# Patient Record
Sex: Female | Born: 1990 | Race: Black or African American | Hispanic: No | Marital: Single | Smoking: Never smoker
Health system: Southern US, Community
[De-identification: ages and names within clinical notes are randomized; demographics above are authoritative.]

## PROBLEM LIST (undated history)

## (undated) ENCOUNTER — Inpatient Hospital Stay (HOSPITAL_COMMUNITY): Payer: Self-pay

## (undated) DIAGNOSIS — D649 Anemia, unspecified: Secondary | ICD-10-CM

---

## 2012-01-16 ENCOUNTER — Emergency Department (INDEPENDENT_AMBULATORY_CARE_PROVIDER_SITE_OTHER)
Admission: EM | Admit: 2012-01-16 | Discharge: 2012-01-16 | Disposition: A | Discharge status: Home / Self Care | Payer: Self-pay | Source: Home / Self Care | Attending: Family Medicine | Admitting: Family Medicine

## 2012-01-16 ENCOUNTER — Encounter (HOSPITAL_COMMUNITY): Payer: Self-pay

## 2012-01-16 DIAGNOSIS — S39012A Strain of muscle, fascia and tendon of lower back, initial encounter: Secondary | ICD-10-CM

## 2012-01-16 DIAGNOSIS — S335XXA Sprain of ligaments of lumbar spine, initial encounter: Secondary | ICD-10-CM

## 2012-01-16 HISTORY — DX: Anemia, unspecified: D64.9

## 2012-01-16 LAB — POCT URINALYSIS DIP (DEVICE)
Bilirubin Urine: NEGATIVE
Hgb urine dipstick: NEGATIVE
Ketones, ur: NEGATIVE mg/dL
pH: 6.5 (ref 5.0–8.0)

## 2012-01-16 MED ORDER — IBUPROFEN 800 MG PO TABS: 800.0000 mg | ORAL_TABLET | Freq: Three times a day (TID) | ORAL | Status: AC

## 2012-01-16 MED ORDER — CYCLOBENZAPRINE HCL 5 MG PO TABS: 5.0000 mg | ORAL_TABLET | Freq: Three times a day (TID) | ORAL | Status: AC | PRN

## 2012-01-16 NOTE — ED Notes (Addendum)
Pt has low back pain that started on 1-31.  No known injury and denies urinary symptoms.

## 2012-01-16 NOTE — ED Provider Notes (Addendum)
History     CSN: [id number]  Arrival date & time 01/16/12  1042   First MD Initiated Contact with Patient 01/16/12 1225      Chief Complaint  Patient presents with  . Back Pain    (Consider location/radiation/quality/duration/timing/severity/associated sxs/prior treatment) Patient is a 21 y.o. female presenting with back pain. The history is provided by the patient.  Back Pain  This is a new problem. The current episode started more than 1 week ago. The problem has not changed since onset.The pain is associated with no known injury. The pain is present in the lumbar spine. The quality of the pain is described as shooting. The pain does not radiate. The pain is mild. The symptoms are aggravated by bending, twisting and certain positions. Pertinent negatives include no chest pain, no fever, no numbness, no abdominal pain, no abdominal swelling, no bowel incontinence, no perianal numbness, no paresthesias and no tingling. She has tried nothing for the symptoms.    Past Medical History  Diagnosis Date  . Anemia     History reviewed. No pertinent past surgical history.  History reviewed. No pertinent family history.  History  Substance Use Topics  . Smoking status: Never Smoker   . Smokeless tobacco: Not on file  . Alcohol Use: Yes    OB History    Grav Para Term Preterm Abortions TAB SAB Ect Mult Living                  Review of Systems  Constitutional: Negative for fever.  HENT: Negative.   Cardiovascular: Negative for chest pain.  Gastrointestinal: Negative.  Negative for abdominal pain and bowel incontinence.  Genitourinary: Negative.   Musculoskeletal: Positive for back pain.  Neurological: Negative for tingling, numbness and paresthesias.    Allergies  Review of patient's allergies indicates no known allergies.  Home Medications   Current Outpatient Rx  Name Route Sig Dispense Refill  . ACETAMINOPHEN 325 MG PO TABS Oral Take 650 mg by mouth every 6 (six)  hours as needed.    Marland Kitchen FERROUS SULFATE 325 (65 FE) MG PO TABS Oral Take 325 mg by mouth daily with breakfast.    . CYCLOBENZAPRINE HCL 5 MG PO TABS Oral Take 1 tablet (5 mg total) by mouth 3 (three) times daily as needed for muscle spasms. 30 tablet 0  . IBUPROFEN 800 MG PO TABS Oral Take 1 tablet (800 mg total) by mouth 3 (three) times daily. As needed for soreness 30 tablet 0    BP 166/71  Pulse 92  Temp(Src) 97.9 F (36.6 C) (Oral)  Resp 18  SpO2 100%  LMP 01/01/2012  Physical Exam  Nursing note and vitals reviewed. Constitutional: She is oriented to person, place, and time. She appears well-developed and well-nourished.  Musculoskeletal: Normal range of motion. She exhibits tenderness.       Minor soreness with rom, no radiation of pain, abd benign.  Neurological: She is alert and oriented to person, place, and time.  Skin: Skin is warm.    ED Course  Procedures (including critical care time)  Labs Reviewed  POCT URINALYSIS DIP (DEVICE) - Abnormal; Notable for the following:    Leukocytes, UA LARGE (*) Biochemical Testing Only. Please order routine urinalysis from main lab if confirmatory testing is needed.   All other components within normal limits   No results found.   1. Strain of lumbar paraspinous muscle       MDM  Barkley Bruns, MD 01/16/12 1240  Barkley Bruns, MD 01/16/12 [phone]

## 2012-10-21 ENCOUNTER — Encounter (HOSPITAL_COMMUNITY): Payer: Self-pay

## 2012-10-21 ENCOUNTER — Emergency Department (INDEPENDENT_AMBULATORY_CARE_PROVIDER_SITE_OTHER)
Admission: EM | Admit: 2012-10-21 | Discharge: 2012-10-21 | Disposition: A | Discharge status: Home / Self Care | Payer: Self-pay | Source: Home / Self Care | Attending: Emergency Medicine | Admitting: Emergency Medicine

## 2012-10-21 DIAGNOSIS — J111 Influenza due to unidentified influenza virus with other respiratory manifestations: Secondary | ICD-10-CM

## 2012-10-21 DIAGNOSIS — N390 Urinary tract infection, site not specified: Secondary | ICD-10-CM

## 2012-10-21 DIAGNOSIS — R6889 Other general symptoms and signs: Secondary | ICD-10-CM

## 2012-10-21 LAB — POCT URINALYSIS DIP (DEVICE)
Bilirubin Urine: NEGATIVE
Glucose, UA: NEGATIVE mg/dL
Nitrite: POSITIVE — AB
Specific Gravity, Urine: 1.02 (ref 1.005–1.030)

## 2012-10-21 LAB — POCT RAPID STREP A: Streptococcus, Group A Screen (Direct): NEGATIVE

## 2012-10-21 MED ORDER — ONDANSETRON HCL 4 MG PO TABS: 4.0000 mg | ORAL_TABLET | Freq: Four times a day (QID) | ORAL | Status: DC

## 2012-10-21 MED ORDER — CEPHALEXIN 500 MG PO CAPS: 500.0000 mg | ORAL_CAPSULE | Freq: Three times a day (TID) | ORAL | Status: DC

## 2012-10-21 NOTE — ED Notes (Signed)
Sore throat, body aches since 10/13/12

## 2012-10-21 NOTE — ED Provider Notes (Signed)
History     CSN: [id number]  Arrival date & time 10/21/12  1247   First MD Initiated Contact with Patient 10/21/12 1601      Chief Complaint  Patient presents with  . Sore Throat    (Consider location/radiation/quality/duration/timing/severity/associated sxs/prior treatment) Patient is a 21 y.o. female presenting with pharyngitis. The history is provided by the patient.  Sore Throat This is a new problem. The current episode started more than 1 week ago. The problem occurs daily. Associated symptoms include chest pain, abdominal pain, headaches and shortness of breath.    Past Medical History  Diagnosis Date  . Anemia     History reviewed. No pertinent past surgical history.  No family history on file.  History  Substance Use Topics  . Smoking status: Never Smoker   . Smokeless tobacco: Not on file  . Alcohol Use: Yes    OB History    Grav Para Term Preterm Abortions TAB SAB Ect Mult Living                  Review of Systems  Constitutional: Positive for fever, chills, appetite change and fatigue.  HENT: Positive for congestion, sore throat, rhinorrhea, neck pain and sinus pressure.   Respiratory: Positive for cough and shortness of breath.   Cardiovascular: Positive for chest pain.  Gastrointestinal: Positive for nausea and abdominal pain.  Musculoskeletal: Positive for myalgias.  Neurological: Positive for headaches.  All other systems reviewed and are negative.    Allergies  Review of patient's allergies indicates no known allergies.  Home Medications   Current Outpatient Rx  Name  Route  Sig  Dispense  Refill  . ACETAMINOPHEN 325 MG PO TABS   Oral   Take 650 mg by mouth every 6 (six) hours as needed.         Marland Kitchen FERROUS SULFATE 325 (65 FE) MG PO TABS   Oral   Take 325 mg by mouth daily with breakfast.         . CEPHALEXIN 500 MG PO CAPS   Oral   Take 1 capsule (500 mg total) by mouth 3 (three) times daily.   30 capsule   0   .  ONDANSETRON HCL 4 MG PO TABS   Oral   Take 1 tablet (4 mg total) by mouth every 6 (six) hours.   12 tablet   0     BP 147/82  Pulse 121  Temp 101.9 F (38.8 C) (Oral)  Resp 20  SpO2 100%  LMP 09/30/2012  Physical Exam  Nursing note and vitals reviewed. Constitutional: She is oriented to person, place, and time. Vital signs are normal. She appears well-developed and well-nourished. She is active and cooperative.  HENT:  Head: Normocephalic.  Right Ear: Hearing, tympanic membrane, external ear and ear canal normal.  Left Ear: Hearing, tympanic membrane, external ear and ear canal normal.  Nose: Nose normal. Right sinus exhibits no maxillary sinus tenderness and no frontal sinus tenderness. Left sinus exhibits no maxillary sinus tenderness and no frontal sinus tenderness.  Mouth/Throat: Uvula is midline and mucous membranes are normal. Posterior oropharyngeal edema and posterior oropharyngeal erythema present.  Eyes: Conjunctivae normal are normal. Pupils are equal, round, and reactive to light. No scleral icterus.  Neck: Trachea normal, normal range of motion and full passive range of motion without pain. Neck supple.  Cardiovascular: Normal rate, regular rhythm, normal heart sounds, intact distal pulses and normal pulses.   Pulmonary/Chest: Effort normal and breath sounds  normal.  Abdominal: Soft. Bowel sounds are normal. There is tenderness in the suprapubic area. There is no CVA tenderness.  Musculoskeletal: Normal range of motion.  Lymphadenopathy:       Head (right side): No submental, no submandibular, no tonsillar, no preauricular, no posterior auricular and no occipital adenopathy present.       Head (left side): No submental, no submandibular, no tonsillar, no preauricular, no posterior auricular and no occipital adenopathy present.  Neurological: She is alert and oriented to person, place, and time. She has normal strength. No cranial nerve deficit or sensory deficit.  Coordination and gait normal. GCS eye subscore is 4. GCS verbal subscore is 5. GCS motor subscore is 6.  Skin: Skin is warm and dry. No rash noted.  Psychiatric: She has a normal mood and affect. Her speech is normal and behavior is normal. Judgment and thought content normal. Cognition and memory are normal.    ED Course  Procedures (including critical care time)  Labs Reviewed  POCT URINALYSIS DIP (DEVICE) - Abnormal; Notable for the following:    Ketones, ur 40 (*)     Hgb urine dipstick MODERATE (*)     Nitrite POSITIVE (*)     Leukocytes, UA SMALL (*)  Biochemical Testing Only. Please order routine urinalysis from main lab if confirmatory testing is needed.   All other components within normal limits  POCT RAPID STREP A ([facility] CARE ONLY)  POCT PREGNANCY, URINE  URINE CULTURE   No results found.   1. Flu-like symptoms   2. UTI (lower urinary tract infection)       MDM  Take medications as prescribed, increase fluids, follow up with primary care if symptoms are not improved.        Johnsie Kindred, NP 10/21/12 1646

## 2012-10-21 NOTE — ED Provider Notes (Signed)
Medical screening examination/treatment/procedure(s) were performed by non-physician practitioner and as supervising physician I was immediately available for consultation/collaboration.  Leslee Home, M.D.   Reuben Likes, MD 10/21/12 2112

## 2012-10-24 LAB — URINE CULTURE: Special Requests: NORMAL

## 2012-10-25 NOTE — ED Notes (Signed)
Urine culture: >100,000 colonies E. Coli.  Pt. adequately treated with Keflex. Nicole Patel 10/25/2012

## 2012-12-07 NOTE — L&D Delivery Note (Signed)
Delivery Note At 12:06 PM a viable female was delivered via Vaginal, Spontaneous Delivery (Presentation: Left Occiput Anterior).  APGAR: 9, 9; weight .   Placenta status: Intact, Spontaneous.  Cord: 3 vessels with the following complications: None.  Cord pH: not done  Anesthesia: Epidural  Episiotomy: None Lacerations: None Suture Repair: 2.0 Est. Blood Loss (mL): 250  Mom to postpartum.  Baby to nursery-stable.  Kaly Mcquary A 07/22/2013, 12:22 PM

## 2013-01-17 ENCOUNTER — Encounter (HOSPITAL_COMMUNITY): Payer: Self-pay

## 2013-01-17 ENCOUNTER — Inpatient Hospital Stay (HOSPITAL_COMMUNITY)
Admission: AD | Admit: 2013-01-17 | Discharge: 2013-01-17 | Disposition: A | Discharge status: Home / Self Care | Payer: Managed Care, Other (non HMO) | Source: Ambulatory Visit | Attending: Obstetrics & Gynecology | Admitting: Obstetrics & Gynecology

## 2013-01-17 DIAGNOSIS — O219 Vomiting of pregnancy, unspecified: Secondary | ICD-10-CM

## 2013-01-17 DIAGNOSIS — O210 Mild hyperemesis gravidarum: Secondary | ICD-10-CM | POA: Insufficient documentation

## 2013-01-17 DIAGNOSIS — R109 Unspecified abdominal pain: Secondary | ICD-10-CM | POA: Insufficient documentation

## 2013-01-17 LAB — URINE MICROSCOPIC-ADD ON

## 2013-01-17 LAB — URINALYSIS, ROUTINE W REFLEX MICROSCOPIC
Glucose, UA: NEGATIVE mg/dL
Protein, ur: NEGATIVE mg/dL
pH: 6 (ref 5.0–8.0)

## 2013-01-17 LAB — POCT PREGNANCY, URINE: Preg Test, Ur: POSITIVE — AB

## 2013-01-17 MED ORDER — PROMETHAZINE HCL 25 MG PO TABS: 12.5000 mg | ORAL_TABLET | Freq: Four times a day (QID) | ORAL | Status: DC | PRN

## 2013-01-17 NOTE — MAU Provider Note (Signed)
Attestation of Attending Supervision of Advanced Practitioner (PA/CNM/NP): Evaluation and management procedures were performed by the Advanced Practitioner under my supervision and collaboration.  I have reviewed the Advanced Practitioner's note and chart, and I agree with the management and plan.  Teffany Blaszczyk, MD, FACOG Attending Obstetrician & Gynecologist Faculty Practice, [facility]  

## 2013-01-17 NOTE — MAU Note (Signed)
Patient is in with c/o n/v and intermittent cramping since last week. She denies vaginal bleeding or abnormal vaginal discharge. Patient states that her lmp was 11/22/12. Patient states that she tolerates jello, saltines crackers and some fluids.

## 2013-01-17 NOTE — MAU Provider Note (Signed)
History     CSN: [id number]  Arrival date and time: 01/17/13 0917   First Provider Initiated Contact with Patient 01/17/13 [id number]     Chief Complaint  Patient presents with  . Nausea  . Emesis  . Abdominal Cramping  . Amenorrhea   HPI  S: Nicole Patel is a 22 y.o. female G1P0 presenting today with a history of abdominal pain, nausea, and vomiting. She states the onset of symptoms started 5 days ago and lasted until yesterday. She states the pain was located in her lower abdomen and it did not radiate. The pain would come and go, but would cause her nausea and vomiting. She states she vomited about 2x/day since last Thursday. She was wondering if she could be pregnant, and that's why she is presenting today (pregnancy verification). However, today her symptoms are resolved and she is currently not experiencing any abdominal pain and has not vomited since yesterday. She states, "I'm fine today."  She still has nausea, but it has lessened in severity.   She denies dysuria, hematuria, change in stool, fever, chills. She also denies any vaginal bleeding, vaginal discharge, itching, burning.  OB History   Grav Para Term Preterm Abortions TAB SAB Ect Mult Living   1              Past Medical History  Diagnosis Date  . Anemia    History reviewed. No pertinent past surgical history.  History reviewed. No pertinent family history.  History  Substance Use Topics  . Smoking status: Never Smoker   . Smokeless tobacco: Not on file  . Alcohol Use: Yes   Allergies: No Known Allergies  Prescriptions prior to admission  Medication Sig Dispense Refill  . ferrous sulfate 325 (65 FE) MG tablet Take 325 mg by mouth daily with breakfast.       ROS  Negative, except what is noted in the HPI  Poshysical Exam   Height 5\' 3"  (1.6 m), weight 167 lb 2 oz (75.807 kg), last menstrual period 11/22/2012.  Physical Exam  General appearance: alert, cooperative, appears stated age and no  distress Head: Normocephalic, without obvious abnormality, atraumatic Back: symmetric, ROM normal, no CVA tenderness Abdomen: soft, non-tender; bowel sounds normal; no masses,  no organomegaly Extremities: extremities normal, atraumatic, no cyanosis or edema  MAU Course  Procedures  MDM Results for orders placed during the hospital encounter of 01/17/13 (from the past 24 hour(s))  POCT PREGNANCY, URINE     Status: Abnormal   Collection Time    01/17/13  9:37 AM      Result Value Range   Preg Test, Ur POSITIVE (*) NEGATIVE    Assessment and Plan  1.) Nausea - Phenergan 25 mg by mouth every 6 hours. Use as needed. 2.) Advised to pursue prenatal care.  NEESE,HOPE 01/17/2013, 10:21 AM   I examined the patient her abdomen is soft, non tender with deep palpation. Patient request pregnancy verification letter so she can get her insurance in place and start prenatal care and work note for today.   Instructions on pregnancy and nausea given information on what to take to HD to get insurance in place and medication for nausea.  Discussed with the patient and all questioned fully answered. She will return if any problems arise.    Medication List    TAKE these medications       ferrous sulfate 325 (65 FE) MG tablet  Take 325 mg by mouth daily with breakfast.  promethazine 25 MG tablet  Commonly known as:  PHENERGAN  Take 0.5 tablets (12.5 mg total) by mouth every 6 (six) hours as needed for nausea.

## 2013-01-19 LAB — URINE CULTURE: Colony Count: >=100000

## 2013-01-20 ENCOUNTER — Other Ambulatory Visit: Payer: Self-pay | Admitting: Obstetrics & Gynecology

## 2013-01-20 DIAGNOSIS — O2340 Unspecified infection of urinary tract in pregnancy, unspecified trimester: Secondary | ICD-10-CM

## 2013-01-20 MED ORDER — CEPHALEXIN 500 MG PO CAPS: 500.0000 mg | ORAL_CAPSULE | Freq: Four times a day (QID) | ORAL | Status: DC

## 2013-01-20 NOTE — Progress Notes (Signed)
Faculty Practice OB/GYN Attending Note  Patient seen in MAU on 01/17/13; had concerning UA but was asymptomatic.  Urine culture was sent that showed >100K colonies of E.coli resistant to ampicillin but sensitive to cephalosporins and other antibiotics. Keflex e-prescribed, patient was called but was not available.  A message was left for her to call MAU and speak to one of the MAU providers who will notify her of her diagnosis and advise her to pick up prescription.  She will also be advised to follow up with OB provider as instructed.  Jaynie Collins, MD, FACOG Attending Obstetrician & Gynecologist Faculty Practice, [facility]

## 2013-02-02 ENCOUNTER — Other Ambulatory Visit (HOSPITAL_COMMUNITY): Payer: Self-pay | Admitting: Obstetrics

## 2013-02-02 DIAGNOSIS — O26842 Uterine size-date discrepancy, second trimester: Secondary | ICD-10-CM

## 2013-02-02 LAB — OB RESULTS CONSOLE HGB/HCT, BLOOD
HCT: 35 %
Hemoglobin: 11.7 g/dL

## 2013-02-02 LAB — OB RESULTS CONSOLE GC/CHLAMYDIA
Chlamydia: NEGATIVE
Gonorrhea: NEGATIVE

## 2013-02-02 LAB — OB RESULTS CONSOLE HEPATITIS B SURFACE ANTIGEN: Hepatitis B Surface Ag: NEGATIVE

## 2013-02-02 LAB — OB RESULTS CONSOLE RPR: RPR: NONREACTIVE

## 2013-02-02 LAB — OB RESULTS CONSOLE PLATELET COUNT: Platelets: 311 10*3/uL

## 2013-02-07 ENCOUNTER — Other Ambulatory Visit (HOSPITAL_COMMUNITY): Payer: Self-pay | Admitting: Obstetrics

## 2013-02-07 ENCOUNTER — Ambulatory Visit (HOSPITAL_COMMUNITY)
Admission: RE | Admit: 2013-02-07 | Discharge: 2013-02-07 | Disposition: A | Discharge status: Home / Self Care | Payer: Managed Care, Other (non HMO) | Source: Ambulatory Visit | Attending: Obstetrics | Admitting: Obstetrics

## 2013-02-07 DIAGNOSIS — O26849 Uterine size-date discrepancy, unspecified trimester: Secondary | ICD-10-CM | POA: Insufficient documentation

## 2013-02-07 DIAGNOSIS — O26842 Uterine size-date discrepancy, second trimester: Secondary | ICD-10-CM

## 2013-02-07 DIAGNOSIS — Z3689 Encounter for other specified antenatal screening: Secondary | ICD-10-CM | POA: Insufficient documentation

## 2013-03-03 ENCOUNTER — Encounter: Payer: Self-pay | Admitting: *Deleted

## 2013-03-03 NOTE — Progress Notes (Unsigned)
02/07/13 - B/P 149/90 and 136/88  1+protein in urine. 02/02/13 - B/P 149/89 and 135/93

## 2013-03-07 ENCOUNTER — Other Ambulatory Visit (HOSPITAL_COMMUNITY): Payer: Self-pay | Admitting: Obstetrics

## 2013-03-07 ENCOUNTER — Ambulatory Visit (INDEPENDENT_AMBULATORY_CARE_PROVIDER_SITE_OTHER): Payer: Managed Care, Other (non HMO) | Admitting: Obstetrics

## 2013-03-07 ENCOUNTER — Encounter: Payer: Self-pay | Admitting: Obstetrics

## 2013-03-07 ENCOUNTER — Ambulatory Visit (INDEPENDENT_AMBULATORY_CARE_PROVIDER_SITE_OTHER): Payer: Managed Care, Other (non HMO)

## 2013-03-07 ENCOUNTER — Other Ambulatory Visit: Payer: Self-pay | Admitting: Obstetrics

## 2013-03-07 VITALS — BP 150/88 | Temp 98.5°F | Wt 175.8 lb

## 2013-03-07 DIAGNOSIS — Z3400 Encounter for supervision of normal first pregnancy, unspecified trimester: Secondary | ICD-10-CM

## 2013-03-07 DIAGNOSIS — Z369 Encounter for antenatal screening, unspecified: Secondary | ICD-10-CM

## 2013-03-07 DIAGNOSIS — Z0489 Encounter for examination and observation for other specified reasons: Secondary | ICD-10-CM

## 2013-03-07 DIAGNOSIS — IMO0002 Reserved for concepts with insufficient information to code with codable children: Secondary | ICD-10-CM

## 2013-03-07 LAB — POCT URINALYSIS DIPSTICK
Blood, UA: NEGATIVE
Glucose, UA: NEGATIVE
Nitrite, UA: NEGATIVE
Urobilinogen, UA: NEGATIVE

## 2013-03-07 LAB — US OB DETAIL + 14 WK

## 2013-03-07 NOTE — Progress Notes (Signed)
Doing well 

## 2013-03-07 NOTE — Progress Notes (Signed)
Pulse: 108

## 2013-04-03 ENCOUNTER — Ambulatory Visit (INDEPENDENT_AMBULATORY_CARE_PROVIDER_SITE_OTHER): Payer: Managed Care, Other (non HMO) | Admitting: Obstetrics

## 2013-04-03 ENCOUNTER — Encounter: Payer: Self-pay | Admitting: Obstetrics

## 2013-04-03 ENCOUNTER — Other Ambulatory Visit: Payer: Managed Care, Other (non HMO)

## 2013-04-03 VITALS — BP 146/94 | Temp 98.3°F | Wt 177.0 lb

## 2013-04-03 DIAGNOSIS — Z3400 Encounter for supervision of normal first pregnancy, unspecified trimester: Secondary | ICD-10-CM

## 2013-04-03 DIAGNOSIS — Z3402 Encounter for supervision of normal first pregnancy, second trimester: Secondary | ICD-10-CM

## 2013-04-03 DIAGNOSIS — N390 Urinary tract infection, site not specified: Secondary | ICD-10-CM

## 2013-04-03 LAB — POCT URINALYSIS DIPSTICK
Spec Grav, UA: <=1.005
Urobilinogen, UA: NEGATIVE
pH, UA: >=9

## 2013-04-03 LAB — CBC
Hemoglobin: 11.2 g/dL — ABNORMAL LOW (ref 12.0–15.0)
MCH: 24.4 pg — ABNORMAL LOW (ref 26.0–34.0)
Platelets: 339 10*3/uL (ref 150–400)
RBC: 4.59 MIL/uL (ref 3.87–5.11)
WBC: 10.6 10*3/uL — ABNORMAL HIGH (ref 4.0–10.5)

## 2013-04-03 NOTE — Progress Notes (Signed)
Pulse: 108

## 2013-04-04 LAB — HIV ANTIBODY (ROUTINE TESTING W REFLEX): HIV: NONREACTIVE

## 2013-04-04 LAB — GLUCOSE TOLERANCE, 2 HOURS W/ 1HR
Glucose, 1 hour: 85 mg/dL (ref 70–170)
Glucose, 2 hour: 83 mg/dL (ref 70–139)
Glucose, Fasting: 56 mg/dL — ABNORMAL LOW (ref 70–99)

## 2013-04-05 LAB — CULTURE, OB URINE

## 2013-04-07 ENCOUNTER — Other Ambulatory Visit: Payer: Self-pay | Admitting: *Deleted

## 2013-04-07 DIAGNOSIS — O2342 Unspecified infection of urinary tract in pregnancy, second trimester: Secondary | ICD-10-CM

## 2013-04-07 MED ORDER — NITROFURANTOIN MONOHYD MACRO 100 MG PO CAPS: 100.0000 mg | ORAL_CAPSULE | Freq: Two times a day (BID) | ORAL | Status: AC

## 2013-04-07 NOTE — Progress Notes (Signed)
Pt aware of results and prescription sent to pt's pharmacy. Pt expressed understanding.   

## 2013-04-18 ENCOUNTER — Ambulatory Visit (INDEPENDENT_AMBULATORY_CARE_PROVIDER_SITE_OTHER): Payer: Managed Care, Other (non HMO) | Admitting: Obstetrics

## 2013-04-18 ENCOUNTER — Encounter: Payer: Managed Care, Other (non HMO) | Admitting: Obstetrics

## 2013-04-18 VITALS — BP 129/85 | Temp 98.3°F | Wt 176.2 lb

## 2013-04-18 DIAGNOSIS — Z3400 Encounter for supervision of normal first pregnancy, unspecified trimester: Secondary | ICD-10-CM

## 2013-04-18 DIAGNOSIS — Z3403 Encounter for supervision of normal first pregnancy, third trimester: Secondary | ICD-10-CM

## 2013-04-18 LAB — POCT URINALYSIS DIPSTICK
Bilirubin, UA: NEGATIVE
Blood, UA: NEGATIVE
Glucose, UA: NEGATIVE
Ketones, UA: NEGATIVE
Spec Grav, UA: 1.015

## 2013-04-18 NOTE — Progress Notes (Signed)
Pulse: 99 

## 2013-04-19 ENCOUNTER — Encounter: Payer: Self-pay | Admitting: Obstetrics

## 2013-05-04 ENCOUNTER — Ambulatory Visit (INDEPENDENT_AMBULATORY_CARE_PROVIDER_SITE_OTHER): Payer: Managed Care, Other (non HMO) | Admitting: Obstetrics

## 2013-05-04 VITALS — BP 127/88 | Temp 98.0°F | Wt 180.6 lb

## 2013-05-04 DIAGNOSIS — K219 Gastro-esophageal reflux disease without esophagitis: Secondary | ICD-10-CM

## 2013-05-04 DIAGNOSIS — Z3402 Encounter for supervision of normal first pregnancy, second trimester: Secondary | ICD-10-CM

## 2013-05-04 DIAGNOSIS — Z3400 Encounter for supervision of normal first pregnancy, unspecified trimester: Secondary | ICD-10-CM

## 2013-05-04 LAB — POCT URINALYSIS DIPSTICK
Bilirubin, UA: NEGATIVE
Glucose, UA: NEGATIVE
Ketones, UA: NEGATIVE
Protein, UA: NEGATIVE
Spec Grav, UA: <=1.005

## 2013-05-04 MED ORDER — RANITIDINE HCL 150 MG PO TABS: 150.0000 mg | ORAL_TABLET | Freq: Two times a day (BID) | ORAL | Status: DC

## 2013-05-04 NOTE — Progress Notes (Signed)
Pulse- 98 

## 2013-05-05 ENCOUNTER — Encounter: Payer: Self-pay | Admitting: Obstetrics

## 2013-05-10 ENCOUNTER — Encounter: Payer: Self-pay | Admitting: Obstetrics

## 2013-05-16 ENCOUNTER — Encounter: Payer: Self-pay | Admitting: Obstetrics

## 2013-05-16 LAB — US OB DETAIL + 14 WK

## 2013-05-18 ENCOUNTER — Ambulatory Visit (INDEPENDENT_AMBULATORY_CARE_PROVIDER_SITE_OTHER): Payer: Managed Care, Other (non HMO) | Admitting: Obstetrics

## 2013-05-18 VITALS — BP 137/86 | Temp 98.6°F | Wt 184.0 lb

## 2013-05-18 DIAGNOSIS — Z3480 Encounter for supervision of other normal pregnancy, unspecified trimester: Secondary | ICD-10-CM

## 2013-05-18 DIAGNOSIS — Z3483 Encounter for supervision of other normal pregnancy, third trimester: Secondary | ICD-10-CM

## 2013-05-18 LAB — POCT URINALYSIS DIPSTICK
Bilirubin, UA: NEGATIVE
Blood, UA: NEGATIVE
Glucose, UA: NEGATIVE
Nitrite, UA: NEGATIVE
Urobilinogen, UA: NEGATIVE
pH, UA: 6

## 2013-05-18 NOTE — Progress Notes (Signed)
Pulse: 108

## 2013-06-05 ENCOUNTER — Ambulatory Visit (INDEPENDENT_AMBULATORY_CARE_PROVIDER_SITE_OTHER): Payer: Managed Care, Other (non HMO) | Admitting: Obstetrics

## 2013-06-05 ENCOUNTER — Encounter: Payer: Self-pay | Admitting: Obstetrics

## 2013-06-05 VITALS — BP 130/84 | Temp 99.0°F | Wt 188.0 lb

## 2013-06-05 DIAGNOSIS — Z3400 Encounter for supervision of normal first pregnancy, unspecified trimester: Secondary | ICD-10-CM

## 2013-06-05 DIAGNOSIS — Z3403 Encounter for supervision of normal first pregnancy, third trimester: Secondary | ICD-10-CM

## 2013-06-05 LAB — POCT URINALYSIS DIPSTICK
Nitrite, UA: NEGATIVE
Protein, UA: NEGATIVE
Spec Grav, UA: 1.01
Urobilinogen, UA: NEGATIVE

## 2013-06-05 NOTE — Progress Notes (Signed)
P 112 Patient c/o back pain- constant.

## 2013-06-19 ENCOUNTER — Ambulatory Visit (INDEPENDENT_AMBULATORY_CARE_PROVIDER_SITE_OTHER): Payer: Managed Care, Other (non HMO) | Admitting: Obstetrics

## 2013-06-19 ENCOUNTER — Encounter: Payer: Self-pay | Admitting: Obstetrics

## 2013-06-19 VITALS — BP 128/85 | Temp 97.9°F | Wt 190.8 lb

## 2013-06-19 DIAGNOSIS — Z3403 Encounter for supervision of normal first pregnancy, third trimester: Secondary | ICD-10-CM

## 2013-06-19 DIAGNOSIS — Z3400 Encounter for supervision of normal first pregnancy, unspecified trimester: Secondary | ICD-10-CM

## 2013-06-19 LAB — POCT URINALYSIS DIPSTICK
Nitrite, UA: NEGATIVE
Protein, UA: NEGATIVE
Urobilinogen, UA: NEGATIVE

## 2013-06-19 NOTE — Progress Notes (Signed)
Pulse: 116 

## 2013-06-26 ENCOUNTER — Ambulatory Visit (INDEPENDENT_AMBULATORY_CARE_PROVIDER_SITE_OTHER): Payer: Managed Care, Other (non HMO) | Admitting: Obstetrics

## 2013-06-26 ENCOUNTER — Encounter: Payer: Self-pay | Admitting: Obstetrics

## 2013-06-26 VITALS — BP 123/86 | Temp 98.0°F | Wt 193.0 lb

## 2013-06-26 DIAGNOSIS — Z3400 Encounter for supervision of normal first pregnancy, unspecified trimester: Secondary | ICD-10-CM

## 2013-06-26 DIAGNOSIS — Z3403 Encounter for supervision of normal first pregnancy, third trimester: Secondary | ICD-10-CM

## 2013-06-26 LAB — US OB DETAIL + 14 WK

## 2013-06-26 NOTE — Progress Notes (Signed)
Pulse- 101 

## 2013-06-28 LAB — STREP B DNA PROBE: GBSP: NEGATIVE

## 2013-07-03 ENCOUNTER — Ambulatory Visit (INDEPENDENT_AMBULATORY_CARE_PROVIDER_SITE_OTHER): Payer: Managed Care, Other (non HMO) | Admitting: Obstetrics

## 2013-07-03 ENCOUNTER — Encounter: Payer: Self-pay | Admitting: Obstetrics

## 2013-07-03 VITALS — BP 125/86 | Temp 98.2°F | Wt 193.8 lb

## 2013-07-03 DIAGNOSIS — Z3403 Encounter for supervision of normal first pregnancy, third trimester: Secondary | ICD-10-CM

## 2013-07-03 DIAGNOSIS — Z3400 Encounter for supervision of normal first pregnancy, unspecified trimester: Secondary | ICD-10-CM

## 2013-07-03 LAB — POCT URINALYSIS DIPSTICK
Bilirubin, UA: NEGATIVE
Ketones, UA: NEGATIVE
Nitrite, UA: NEGATIVE

## 2013-07-03 NOTE — Progress Notes (Signed)
Pulse: 84

## 2013-07-10 ENCOUNTER — Ambulatory Visit (INDEPENDENT_AMBULATORY_CARE_PROVIDER_SITE_OTHER): Payer: Managed Care, Other (non HMO) | Admitting: Obstetrics

## 2013-07-10 ENCOUNTER — Encounter: Payer: Self-pay | Admitting: Obstetrics

## 2013-07-10 VITALS — BP 129/91 | Temp 98.3°F | Wt 199.0 lb

## 2013-07-10 DIAGNOSIS — Z3403 Encounter for supervision of normal first pregnancy, third trimester: Secondary | ICD-10-CM

## 2013-07-10 DIAGNOSIS — Z3400 Encounter for supervision of normal first pregnancy, unspecified trimester: Secondary | ICD-10-CM

## 2013-07-10 LAB — POCT URINALYSIS DIPSTICK
Bilirubin, UA: NEGATIVE
Glucose, UA: NEGATIVE
Nitrite, UA: NEGATIVE
Urobilinogen, UA: NEGATIVE
pH, UA: 7

## 2013-07-10 NOTE — Progress Notes (Signed)
P-103 Pt states she is having lower abdomen and vaginal pressure.

## 2013-07-17 ENCOUNTER — Ambulatory Visit (INDEPENDENT_AMBULATORY_CARE_PROVIDER_SITE_OTHER): Payer: Managed Care, Other (non HMO) | Admitting: Obstetrics

## 2013-07-17 ENCOUNTER — Encounter: Payer: Self-pay | Admitting: Obstetrics

## 2013-07-17 VITALS — BP 133/84 | Temp 98.2°F | Wt 194.2 lb

## 2013-07-17 DIAGNOSIS — Z3403 Encounter for supervision of normal first pregnancy, third trimester: Secondary | ICD-10-CM

## 2013-07-17 DIAGNOSIS — Z3400 Encounter for supervision of normal first pregnancy, unspecified trimester: Secondary | ICD-10-CM

## 2013-07-17 LAB — POCT URINALYSIS DIPSTICK
Glucose, UA: NEGATIVE
Nitrite, UA: NEGATIVE
Urobilinogen, UA: NEGATIVE

## 2013-07-17 NOTE — Progress Notes (Signed)
Pulse- 96 

## 2013-07-21 ENCOUNTER — Inpatient Hospital Stay (HOSPITAL_COMMUNITY)
Admission: AD | Admit: 2013-07-21 | Discharge: 2013-07-24 | DRG: 775 | Disposition: A | Discharge status: Home / Self Care | Payer: Managed Care, Other (non HMO) | Source: Ambulatory Visit | Attending: Obstetrics | Admitting: Obstetrics

## 2013-07-21 ENCOUNTER — Encounter (HOSPITAL_COMMUNITY): Payer: Self-pay | Admitting: *Deleted

## 2013-07-21 LAB — CBC
HCT: 33.8 % — ABNORMAL LOW (ref 36.0–46.0)
MCV: 67.6 fL — ABNORMAL LOW (ref 78.0–100.0)
RBC: 5 MIL/uL (ref 3.87–5.11)
WBC: 13.9 10*3/uL — ABNORMAL HIGH (ref 4.0–10.5)

## 2013-07-21 MED ORDER — LACTATED RINGERS IV SOLN
INTRAVENOUS | Status: DC
  Administered 2013-07-21 – 2013-07-22 (×3): via INTRAVENOUS

## 2013-07-21 MED ORDER — LIDOCAINE HCL (PF) 1 % IJ SOLN
30.0000 mL | INTRAMUSCULAR | Status: DC | PRN
  Filled 2013-07-21 (×2): qty 30

## 2013-07-21 MED ORDER — EPHEDRINE 5 MG/ML INJ
10.0000 mg | INTRAVENOUS | Status: DC | PRN
  Filled 2013-07-21: qty 2

## 2013-07-21 MED ORDER — FENTANYL 2.5 MCG/ML BUPIVACAINE 1/10 % EPIDURAL INFUSION (WH - ANES)
14.0000 mL/h | INTRAMUSCULAR | Status: DC | PRN
  Administered 2013-07-22: 14 mL/h via EPIDURAL
  Filled 2013-07-21: qty 125

## 2013-07-21 MED ORDER — EPHEDRINE 5 MG/ML INJ
10.0000 mg | INTRAVENOUS | Status: DC | PRN
  Filled 2013-07-21: qty 2
  Filled 2013-07-21: qty 4

## 2013-07-21 MED ORDER — DIPHENHYDRAMINE HCL 50 MG/ML IJ SOLN: 12.5000 mg | INTRAMUSCULAR | Status: DC | PRN

## 2013-07-21 MED ORDER — NALBUPHINE SYRINGE 5 MG/0.5 ML
5.0000 mg | INJECTION | INTRAMUSCULAR | Status: DC | PRN
  Administered 2013-07-21 – 2013-07-22 (×3): 5 mg via INTRAVENOUS
  Filled 2013-07-21 (×4): qty 0.5

## 2013-07-21 MED ORDER — ONDANSETRON HCL 4 MG/2ML IJ SOLN
4.0000 mg | Freq: Four times a day (QID) | INTRAMUSCULAR | Status: DC | PRN
  Administered 2013-07-21: 4 mg via INTRAVENOUS
  Filled 2013-07-21: qty 2

## 2013-07-21 MED ORDER — OXYCODONE-ACETAMINOPHEN 5-325 MG PO TABS
1.0000 | ORAL_TABLET | ORAL | Status: DC | PRN
  Administered 2013-07-22: 1 via ORAL
  Filled 2013-07-21: qty 2

## 2013-07-21 MED ORDER — PHENYLEPHRINE 40 MCG/ML (10ML) SYRINGE FOR IV PUSH (FOR BLOOD PRESSURE SUPPORT)
80.0000 ug | PREFILLED_SYRINGE | INTRAVENOUS | Status: DC | PRN
  Filled 2013-07-21: qty 2

## 2013-07-21 MED ORDER — ACETAMINOPHEN 325 MG PO TABS: 650.0000 mg | ORAL_TABLET | ORAL | Status: DC | PRN

## 2013-07-21 MED ORDER — PHENYLEPHRINE 40 MCG/ML (10ML) SYRINGE FOR IV PUSH (FOR BLOOD PRESSURE SUPPORT)
80.0000 ug | PREFILLED_SYRINGE | INTRAVENOUS | Status: DC | PRN
  Filled 2013-07-21: qty 2
  Filled 2013-07-21: qty 5

## 2013-07-21 MED ORDER — OXYTOCIN BOLUS FROM INFUSION: 500.0000 mL | INTRAVENOUS | Status: DC

## 2013-07-21 MED ORDER — FLEET ENEMA 7-19 GM/118ML RE ENEM: 1.0000 | ENEMA | Freq: Once | RECTAL | Status: DC

## 2013-07-21 MED ORDER — LACTATED RINGERS IV SOLN
500.0000 mL | Freq: Once | INTRAVENOUS | Status: AC
  Administered 2013-07-22: 500 mL via INTRAVENOUS

## 2013-07-21 MED ORDER — LACTATED RINGERS IV SOLN
500.0000 mL | INTRAVENOUS | Status: DC | PRN
  Administered 2013-07-22: 500 mL via INTRAVENOUS

## 2013-07-21 MED ORDER — OXYTOCIN 40 UNITS IN LACTATED RINGERS INFUSION - SIMPLE MED
62.5000 mL/h | INTRAVENOUS | Status: DC
  Administered 2013-07-22: 62.5 mL/h via INTRAVENOUS
  Filled 2013-07-21: qty 1000

## 2013-07-21 MED ORDER — CITRIC ACID-SODIUM CITRATE 334-500 MG/5ML PO SOLN: 30.0000 mL | ORAL | Status: DC | PRN

## 2013-07-21 MED ORDER — IBUPROFEN 600 MG PO TABS
600.0000 mg | ORAL_TABLET | Freq: Four times a day (QID) | ORAL | Status: DC | PRN
  Filled 2013-07-21 (×4): qty 1

## 2013-07-21 NOTE — MAU Note (Signed)
Belenda Cruise RN called for report on pt, states she will call back for bed assignment when available.

## 2013-07-21 NOTE — MAU Note (Signed)
PT presents with complaints of contractions that started last night that have gotten worse throughout the day. States some bloody show but denies any leakage of fluid.

## 2013-07-21 NOTE — Progress Notes (Signed)
Dr Gaynell Face notified of pts VE, FHR pattern, contraction, orders received to admit pt.

## 2013-07-22 ENCOUNTER — Inpatient Hospital Stay (HOSPITAL_COMMUNITY): Payer: Managed Care, Other (non HMO) | Admitting: Anesthesiology

## 2013-07-22 ENCOUNTER — Encounter (HOSPITAL_COMMUNITY): Payer: Self-pay | Admitting: Anesthesiology

## 2013-07-22 ENCOUNTER — Encounter (HOSPITAL_COMMUNITY): Payer: Self-pay | Admitting: *Deleted

## 2013-07-22 MED ORDER — TERBUTALINE SULFATE 1 MG/ML IJ SOLN: 0.2500 mg | Freq: Once | INTRAMUSCULAR | Status: DC | PRN

## 2013-07-22 MED ORDER — DIBUCAINE 1 % RE OINT: 1.0000 "application " | TOPICAL_OINTMENT | RECTAL | Status: DC | PRN

## 2013-07-22 MED ORDER — ONDANSETRON HCL 4 MG PO TABS: 4.0000 mg | ORAL_TABLET | ORAL | Status: DC | PRN

## 2013-07-22 MED ORDER — ONDANSETRON HCL 4 MG/2ML IJ SOLN: 4.0000 mg | INTRAMUSCULAR | Status: DC | PRN

## 2013-07-22 MED ORDER — FENTANYL 2.5 MCG/ML BUPIVACAINE 1/10 % EPIDURAL INFUSION (WH - ANES)
INTRAMUSCULAR | Status: DC | PRN
  Administered 2013-07-22: 14 mL/h via EPIDURAL

## 2013-07-22 MED ORDER — LANOLIN HYDROUS EX OINT: TOPICAL_OINTMENT | CUTANEOUS | Status: DC | PRN

## 2013-07-22 MED ORDER — IBUPROFEN 600 MG PO TABS
600.0000 mg | ORAL_TABLET | Freq: Four times a day (QID) | ORAL | Status: DC
  Administered 2013-07-22 – 2013-07-24 (×8): 600 mg via ORAL
  Filled 2013-07-22 (×4): qty 1

## 2013-07-22 MED ORDER — SIMETHICONE 80 MG PO CHEW: 80.0000 mg | CHEWABLE_TABLET | ORAL | Status: DC | PRN

## 2013-07-22 MED ORDER — FERROUS SULFATE 325 (65 FE) MG PO TABS
325.0000 mg | ORAL_TABLET | Freq: Two times a day (BID) | ORAL | Status: DC
  Administered 2013-07-22 – 2013-07-24 (×4): 325 mg via ORAL
  Filled 2013-07-22 (×4): qty 1

## 2013-07-22 MED ORDER — SENNOSIDES-DOCUSATE SODIUM 8.6-50 MG PO TABS
2.0000 | ORAL_TABLET | Freq: Every day | ORAL | Status: DC
  Administered 2013-07-22 – 2013-07-23 (×2): 2 via ORAL

## 2013-07-22 MED ORDER — WITCH HAZEL-GLYCERIN EX PADS: 1.0000 "application " | MEDICATED_PAD | CUTANEOUS | Status: DC | PRN

## 2013-07-22 MED ORDER — LIDOCAINE HCL (PF) 1 % IJ SOLN
INTRAMUSCULAR | Status: DC | PRN
  Administered 2013-07-22 (×2): 3 mL

## 2013-07-22 MED ORDER — ZOLPIDEM TARTRATE 5 MG PO TABS: 5.0000 mg | ORAL_TABLET | Freq: Every evening | ORAL | Status: DC | PRN

## 2013-07-22 MED ORDER — OXYCODONE-ACETAMINOPHEN 5-325 MG PO TABS
1.0000 | ORAL_TABLET | ORAL | Status: DC | PRN
  Administered 2013-07-23: 1 via ORAL
  Filled 2013-07-22: qty 1

## 2013-07-22 MED ORDER — PRENATAL MULTIVITAMIN CH
1.0000 | ORAL_TABLET | Freq: Every day | ORAL | Status: DC
  Administered 2013-07-23 – 2013-07-24 (×2): 1 via ORAL
  Filled 2013-07-22 (×2): qty 1

## 2013-07-22 MED ORDER — TETANUS-DIPHTH-ACELL PERTUSSIS 5-2.5-18.5 LF-MCG/0.5 IM SUSP
0.5000 mL | Freq: Once | INTRAMUSCULAR | Status: AC
Start: 2013-07-23 — End: 2013-07-23
  Administered 2013-07-23: 0.5 mL via INTRAMUSCULAR
  Filled 2013-07-22: qty 0.5

## 2013-07-22 MED ORDER — BENZOCAINE-MENTHOL 20-0.5 % EX AERO
1.0000 "application " | INHALATION_SPRAY | CUTANEOUS | Status: DC | PRN
  Administered 2013-07-22: 1 via TOPICAL
  Filled 2013-07-22: qty 56

## 2013-07-22 MED ORDER — OXYTOCIN 40 UNITS IN LACTATED RINGERS INFUSION - SIMPLE MED
1.0000 m[IU]/min | INTRAVENOUS | Status: DC
  Administered 2013-07-22: 2 m[IU]/min via INTRAVENOUS

## 2013-07-22 MED ORDER — DIPHENHYDRAMINE HCL 25 MG PO CAPS: 25.0000 mg | ORAL_CAPSULE | Freq: Four times a day (QID) | ORAL | Status: DC | PRN

## 2013-07-22 NOTE — Anesthesia Preprocedure Evaluation (Addendum)
Anesthesia Evaluation  Patient identified by MRN, date of birth, ID band Patient awake    Reviewed: Allergy & Precautions, H&P , NPO status , Patient's Chart, lab work & pertinent test results  Airway Mallampati: II TM Distance: >3 FB Neck ROM: Full    Dental  (+) Dental Advisory Given   Pulmonary neg pulmonary ROS,  breath sounds clear to auscultation        Cardiovascular negative cardio ROS  Rhythm:Regular Rate:Normal     Neuro/Psych negative neurological ROS  negative psych ROS   GI/Hepatic negative GI ROS, Neg liver ROS,   Endo/Other  negative endocrine ROS  Renal/GU negative Renal ROS     Musculoskeletal negative musculoskeletal ROS (+)   Abdominal   Peds  Hematology negative hematology ROS (+)   Anesthesia Other Findings   Reproductive/Obstetrics (+) Pregnancy                           Anesthesia Physical Anesthesia Plan  ASA: II  Anesthesia Plan: Epidural   Post-op Pain Management:    Induction:   Airway Management Planned:   Additional Equipment:   Intra-op Plan:   Post-operative Plan:   Informed Consent: I have reviewed the patients History and Physical, chart, labs and discussed the procedure including the risks, benefits and alternatives for the proposed anesthesia with the patient or authorized representative who has indicated his/her understanding and acceptance.     Plan Discussed with:   Anesthesia Plan Comments:         Anesthesia Quick Evaluation  

## 2013-07-22 NOTE — H&P (Signed)
This is Dr. Francoise Ceo dictating the history and physical on Nicole Patel she's a 22 year old gravida 1 at 43 weeks and 2 days EDC 821 the 14th negative GBS she was admitted in labor 4 cm dilated and she is now 7 cm 90% with the vertex at -2 station amniotomy was performed and the fluids clear her contractions have been 80 regular Past medical history negative Past surgical history negative Social history negative System review noncontributory Physical exam well-developed female in labor HEENT negative Lungs clear to P&A Heart regular rhythm no murmurs no gallops Abdomen term Extremities negative

## 2013-07-22 NOTE — Anesthesia Procedure Notes (Signed)
Epidural Patient location during procedure: OB Start time: 07/22/2013 6:25 AM End time: 07/22/2013 6:45 AM  Staffing Anesthesiologist: Lewie Loron R Performed by: anesthesiologist   Preanesthetic Checklist Completed: patient identified, pre-op evaluation, timeout performed, IV checked, risks and benefits discussed and monitors and equipment checked  Epidural Patient position: sitting Prep: site prepped and draped and DuraPrep Patient monitoring: blood pressure, continuous pulse ox and heart rate Approach: midline Injection technique: LOR air and LOR saline  Needle:  Needle type: Tuohy  Needle gauge: 17 G Needle length: 9 cm Needle insertion depth: 8 cm Catheter type: closed end flexible Catheter size: 19 Gauge Catheter at skin depth: 14 cm Test dose: negative  Assessment Sensory level: T8 Events: blood not aspirated, injection not painful, no injection resistance, negative IV test and no paresthesia  Additional Notes Reason for block:procedure for pain

## 2013-07-22 NOTE — Progress Notes (Signed)
Patient ID: Nicole Patel, female   DOB: 07-02-1991, 23 y.o.   MRN: [id number] Last cervical check was 7 cm she is in the process to get in an epidural at this time

## 2013-07-22 NOTE — Anesthesia Postprocedure Evaluation (Signed)
  Anesthesia Post-op Note  Patient: Nicole Patel  Procedure(s) Performed: * No procedures listed *  Patient Location: Mother/Baby  Anesthesia Type:Epidural  Level of Consciousness: awake  Airway and Oxygen Therapy: Patient Spontanous Breathing  Post-op Pain: mild  Post-op Assessment: Patient's Cardiovascular Status Stable, Respiratory Function Stable, No signs of Nausea or vomiting, Adequate PO intake, Pain level controlled, No headache, No residual numbness and No residual motor weakness  Post-op Vital Signs: Reviewed and stable  Complications: No apparent anesthesia complications

## 2013-07-23 LAB — CBC
Hemoglobin: 9.1 g/dL — ABNORMAL LOW (ref 12.0–15.0)
RBC: 4.3 MIL/uL (ref 3.87–5.11)

## 2013-07-23 NOTE — Progress Notes (Signed)
Patient ID: Nicole Patel, female   DOB: [birth year], 22 y.o.   MRN: [id number] Postpartum day one Vital signs normal Fundus firm Lochia moderate Doing well and

## 2013-07-24 ENCOUNTER — Encounter: Payer: Managed Care, Other (non HMO) | Admitting: Obstetrics

## 2013-07-24 MED ORDER — IBUPROFEN 600 MG PO TABS: 600.0000 mg | ORAL_TABLET | Freq: Four times a day (QID) | ORAL | Status: DC | PRN

## 2013-07-24 MED ORDER — FUSION PLUS PO CAPS: 1.0000 | ORAL_CAPSULE | Freq: Every day | ORAL | Status: DC

## 2013-07-24 MED ORDER — OXYCODONE-ACETAMINOPHEN 5-325 MG PO TABS: 1.0000 | ORAL_TABLET | ORAL | Status: DC | PRN

## 2013-07-24 NOTE — Progress Notes (Signed)
Post Partum Day 2 Subjective: no complaints  Objective: Blood pressure 130/86, pulse 81, temperature 98.4 F (36.9 C), temperature source Oral, resp. rate 19, height 5\' 3"  (1.6 m), weight 194 lb (87.998 kg), last menstrual period 11/22/2012, SpO2 99.00%, unknown if currently breastfeeding.  Physical Exam:  General: alert and no distress Lochia: appropriate Uterine Fundus: firm Incision: healing well DVT Evaluation: No evidence of DVT seen on physical exam.   Recent Labs  07/21/13 1635 07/23/13 0642  HGB 10.7* 9.1*  HCT 33.8* 29.7*    Assessment/Plan: Discharge home   LOS: 3 days   HARPER,CHARLES A 07/24/2013, 9:13 AM

## 2013-07-24 NOTE — Discharge Summary (Signed)
Obstetric Discharge Summary Reason for Admission: onset of labor Prenatal Procedures: ultrasound Intrapartum Procedures: spontaneous vaginal delivery Postpartum Procedures: none Complications-Operative and Postpartum: none Hemoglobin  Date Value Range Status  07/23/2013 9.1* 12.0 - 15.0 g/dL Final  1/61/0960 45.4   Final     HCT  Date Value Range Status  07/23/2013 29.7* 36.0 - 46.0 % Final  02/02/2013 35   Final    Physical Exam:  General: alert and no distress Lochia: appropriate Uterine Fundus: firm Incision: healing well DVT Evaluation: No evidence of DVT seen on physical exam.  Discharge Diagnoses: Term Pregnancy-delivered  Discharge Information: Date: 07/24/2013 Activity: pelvic rest Diet: routine Medications: PNV, Ibuprofen, Colace, Iron and Percocet Condition: stable Instructions: refer to practice specific booklet Discharge to: home Follow-up Information   Follow up with HARPER,CHARLES A, MD. Schedule an appointment as soon as possible for a visit in 2 weeks.   Specialty:  Obstetrics and Gynecology   Contact information:   [address] [place] [address] [phone]       Newborn Data: Live born female  Birth Weight: 7 lb 15 oz (3600 g) APGAR: 9, 9  Home with mother.  HARPER,CHARLES A 07/24/2013, 9:16 AM

## 2013-07-31 ENCOUNTER — Encounter: Payer: Managed Care, Other (non HMO) | Admitting: Obstetrics

## 2013-08-10 ENCOUNTER — Encounter: Payer: Managed Care, Other (non HMO) | Admitting: Obstetrics

## 2013-08-14 ENCOUNTER — Ambulatory Visit: Payer: Managed Care, Other (non HMO) | Admitting: Obstetrics

## 2013-08-18 ENCOUNTER — Ambulatory Visit (INDEPENDENT_AMBULATORY_CARE_PROVIDER_SITE_OTHER): Payer: Managed Care, Other (non HMO) | Admitting: Advanced Practice Midwife

## 2013-08-18 ENCOUNTER — Encounter: Payer: Self-pay | Admitting: Advanced Practice Midwife

## 2013-08-18 VITALS — BP 133/94 | HR 90 | Temp 98.0°F | Ht 63.0 in | Wt 181.0 lb

## 2013-08-18 DIAGNOSIS — IMO0001 Reserved for inherently not codable concepts without codable children: Secondary | ICD-10-CM

## 2013-08-18 DIAGNOSIS — Z309 Encounter for contraceptive management, unspecified: Secondary | ICD-10-CM

## 2013-08-18 DIAGNOSIS — Z3202 Encounter for pregnancy test, result negative: Secondary | ICD-10-CM

## 2013-08-18 LAB — POCT URINE PREGNANCY: Preg Test, Ur: NEGATIVE

## 2013-08-18 MED ORDER — MEDROXYPROGESTERONE ACETATE 150 MG/ML IM SUSP: 150.0000 mg | INTRAMUSCULAR | Status: DC

## 2013-08-18 NOTE — Progress Notes (Signed)
.   Subjective:     Nicole Patel is a 22 y.o. female who presents for a postpartum visit. She is 3 weeks postpartum following a spontaneous vaginal delivery. I have fully reviewed the prenatal and intrapartum course. The delivery was at 39 gestational weeks. Outcome: spontaneous vaginal delivery. Anesthesia: epidural. Postpartum course has been normal. Baby's course has been normal. Baby is feeding by bottle Rush Barer. Bleeding no bleeding. Bowel function is normal. Bladder function is normal. Patient is not sexually active. Contraception method is none. Postpartum depression screening: negative.  Patient is interested in starting the Depo injections.  The following portions of the patient's history were reviewed and updated as appropriate: allergies, current medications, past family history, past medical history, past social history, past surgical history and problem list.  Review of Systems A comprehensive review of systems was negative.   Objective:    BP 138/99  Pulse 90  Temp(Src) 98 F (36.7 C) (Oral)  Ht 5\' 3"  (1.6 m)  Wt 181 lb (82.101 kg)  BMI 32.07 kg/m2  Breastfeeding? No  General:  alert and cooperative           Abdomen:   soft, non-tender; bowel sounds normal; no masses,  no organomegaly and fundus firm      Repeat BP 133/94 Assessment:     3 week postpartum exam. Pap smear not done at today's visit.  Canindate for Depo  Plan:    1. Contraception: Depo-Provera injections 2. REviewed for patient to abstain or use condoms prior to injection 3. Follow up in: 1 weeks or as needed.  4. RTC for Nurse visit BP check and Depo. If BP remains elevated consider treatment.   Derrick Tiegs Wilson Singer CNM

## 2013-08-24 ENCOUNTER — Ambulatory Visit: Payer: Managed Care, Other (non HMO)

## 2013-08-25 ENCOUNTER — Ambulatory Visit (INDEPENDENT_AMBULATORY_CARE_PROVIDER_SITE_OTHER): Payer: Managed Care, Other (non HMO) | Admitting: *Deleted

## 2013-08-25 VITALS — BP 130/89 | HR 80 | Temp 98.4°F | Ht 63.0 in | Wt 181.6 lb

## 2013-08-25 DIAGNOSIS — Z3049 Encounter for surveillance of other contraceptives: Secondary | ICD-10-CM

## 2013-08-28 MED ORDER — MEDROXYPROGESTERONE ACETATE 150 MG/ML IM SUSP
150.0000 mg | INTRAMUSCULAR | Status: AC
  Administered 2013-08-28: 150 mg via INTRAMUSCULAR

## 2013-08-28 NOTE — Progress Notes (Signed)
Pt in office for a Depo Injection. Pt due for next injection on 11/16/13.

## 2013-09-07 ENCOUNTER — Ambulatory Visit: Payer: Managed Care, Other (non HMO) | Admitting: Obstetrics

## 2013-09-07 ENCOUNTER — Encounter: Payer: Self-pay | Admitting: Obstetrics

## 2013-09-07 NOTE — Progress Notes (Unsigned)
Subjective:     Nicole Patel is a 22 y.o. female who presents for a postpartum visit. She is 6 weeks postpartum following a spontaneous vaginal delivery. I have fully reviewed the prenatal and intrapartum course. The delivery was at 39 gestational weeks. Outcome: spontaneous vaginal delivery. Anesthesia: epidural. Postpartum course has been WNL. Baby's course has been WNL. Baby is feeding by bottle - Daron Offer. Bleeding no bleeding. Bowel function is normal. Bladder function is normal. Patient is not sexually active. Contraception method is Depo-Provera injections. Postpartum depression screening: negative.  The following portions of the patient's history were reviewed and updated as appropriate: allergies, current medications, past family history, past medical history, past social history, past surgical history and problem list.  Review of Systems {ros; complete:30496}   Objective:    BP 133/96  Pulse 83  Temp(Src) 97.8 F (36.6 C)  Wt 186 lb (84.369 kg)  BMI 32.96 kg/m2  LMP 08/14/2013  Breastfeeding? No  General:  {gen appearance:16600}   Breasts:  {breast exam:1202::"inspection negative, no nipple discharge or bleeding, no masses or nodularity palpable"}  Lungs: {lung exam:16931}  Heart:  {heart exam:5510}  Abdomen: {abdomen exam:16834}   Vulva:  {labia exam:12198}  Vagina: {vagina exam:12200}  Cervix:  {cervix exam:14595}  Corpus: {uterus exam:12215}  Adnexa:  {adnexa exam:12223}  Rectal Exam: {rectal/vaginal exam:12274}        Assessment:    *** postpartum exam. Pap smear {done:10129} at today's visit.   Plan:    1. Contraception: {method:5051} 2. *** 3. Follow up in: {1-10:13787} {time; units:19136} or as needed.

## 2013-09-18 ENCOUNTER — Ambulatory Visit (INDEPENDENT_AMBULATORY_CARE_PROVIDER_SITE_OTHER): Payer: Managed Care, Other (non HMO) | Admitting: Obstetrics

## 2013-09-18 ENCOUNTER — Encounter: Payer: Self-pay | Admitting: Obstetrics

## 2013-09-18 VITALS — BP 136/93 | HR 89 | Temp 98.8°F | Ht 63.0 in | Wt 188.0 lb

## 2013-09-18 DIAGNOSIS — Z309 Encounter for contraceptive management, unspecified: Secondary | ICD-10-CM

## 2013-09-18 DIAGNOSIS — Z3202 Encounter for pregnancy test, result negative: Secondary | ICD-10-CM

## 2013-09-18 DIAGNOSIS — IMO0001 Reserved for inherently not codable concepts without codable children: Secondary | ICD-10-CM

## 2013-09-18 LAB — POCT URINE PREGNANCY: Preg Test, Ur: NEGATIVE

## 2013-09-18 NOTE — Progress Notes (Signed)
Subjective:     Nicole Patel is a 22 y.o. female who presents for a postpartum visit. She is 7 weeks postpartum following a spontaneous vaginal delivery. I have fully reviewed the prenatal and intrapartum course. The delivery was at 39 gestational weeks. Outcome: spontaneous vaginal delivery. Anesthesia: spinal. Postpartum course has been WNL. Baby's course has been WNL. Baby is feeding by bottle Rush Barer- Soothe. Bleeding no bleeding. Bowel function is normal. Bladder function is normal. Patient is not sexually active. Contraception method is abstinence and Depo-Provera injections. Postpartum depression screening: negative.  The following portions of the patient's history were reviewed and updated as appropriate: allergies, current medications, past family history, past medical history, past social history, past surgical history and problem list.  Review of Systems Pertinent items are noted in HPI.   Objective:    BP 136/93  Pulse 89  Temp(Src) 98.8 F (37.1 C)  Ht 5\' 3"  (1.6 m)  Wt 188 lb (85.276 kg)  BMI 33.31 kg/m2  LMP 08/14/2013  Breastfeeding? No  General:  alert and no distress   Breasts:  inspection negative, no nipple discharge or bleeding, no masses or nodularity palpable  Abdomen:  Soft, NT Pelvic: Uterus NSSC, NT.                                  Assessment:     Normal postpartum exam. Pap smear not done at today's visit.   Plan:    1. Contraception: Depo-Provera injections 2. Continue PNV's 3. Follow up in: several months or as needed.

## 2013-11-16 ENCOUNTER — Ambulatory Visit: Payer: Managed Care, Other (non HMO) | Admitting: Obstetrics

## 2013-11-16 ENCOUNTER — Ambulatory Visit: Payer: Managed Care, Other (non HMO)

## 2014-10-08 ENCOUNTER — Encounter: Payer: Self-pay | Admitting: Obstetrics

## 2014-10-20 IMAGING — US US OB DETAIL+14 WK
1 series · 12 of 28 positions shown · non-contrast
Comparison: none

[Series 1: us ob comp less 14 wks · 67 acquisitions, 12 frames shown]
[im 3/67]
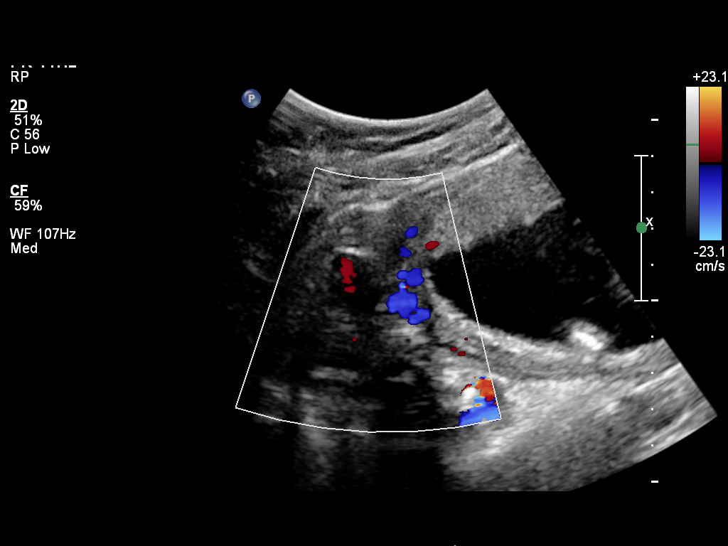
[im 8/67]
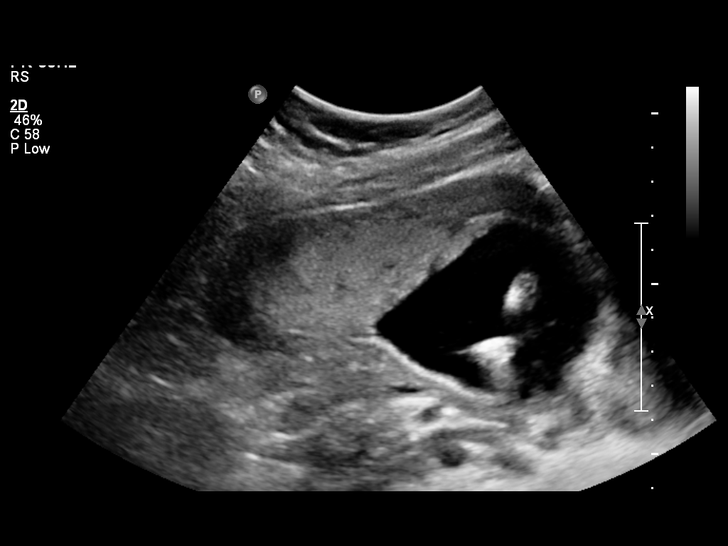
[im 13/67]
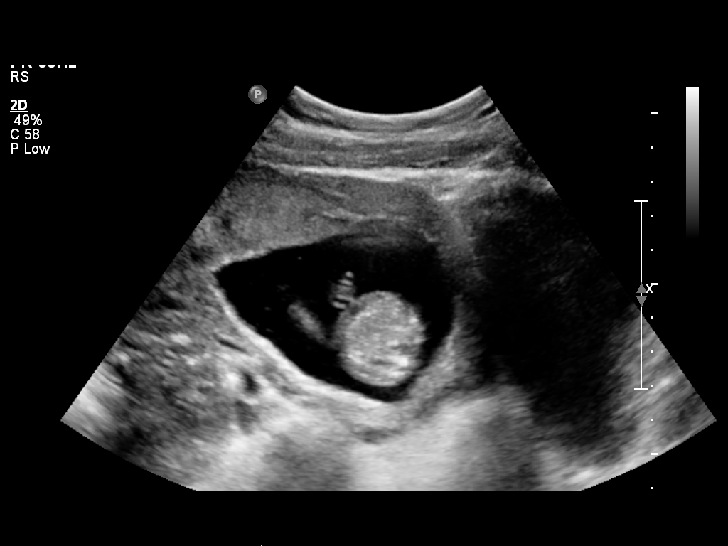
[im 20/67]
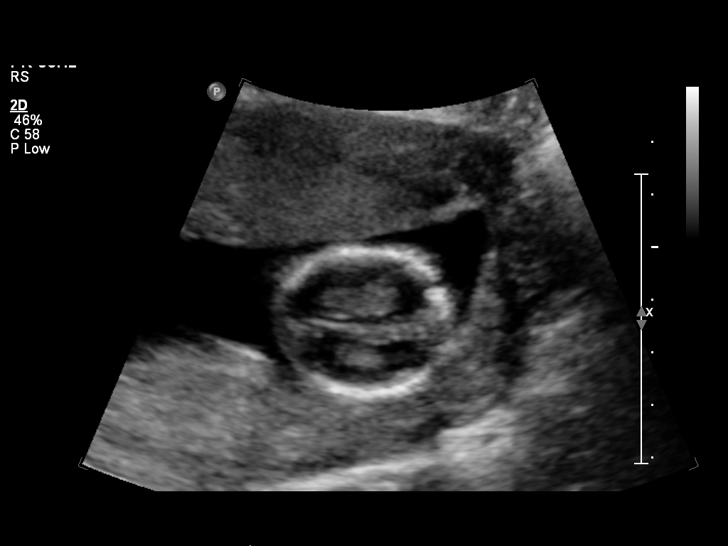
[im 25/67]
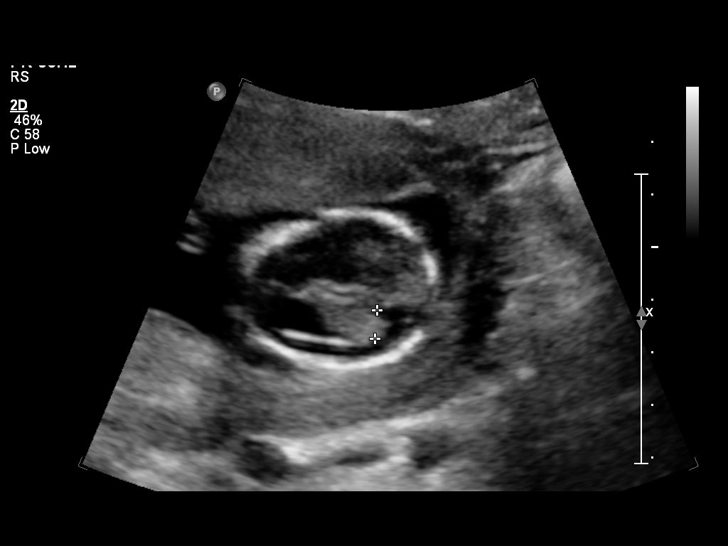
[im 30/67]
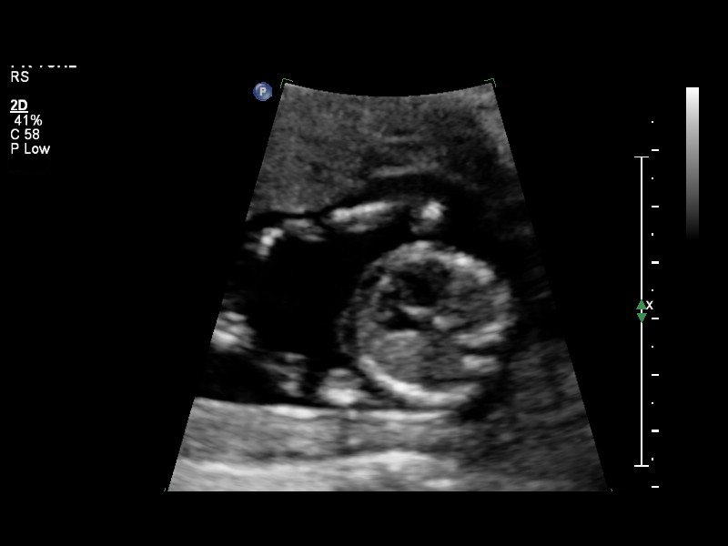
[im 37/67]
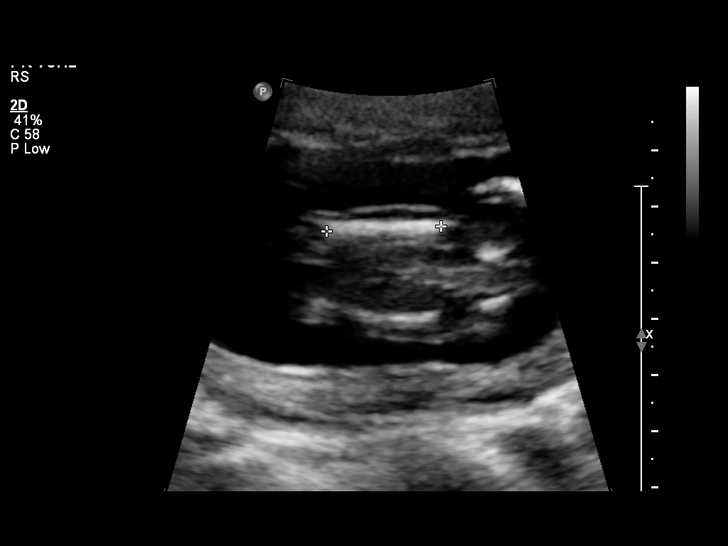
[im 42/67]
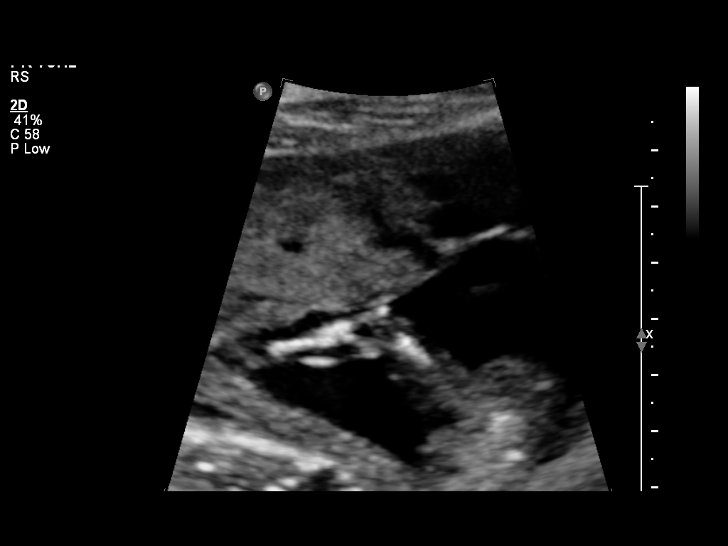
[im 47/67]
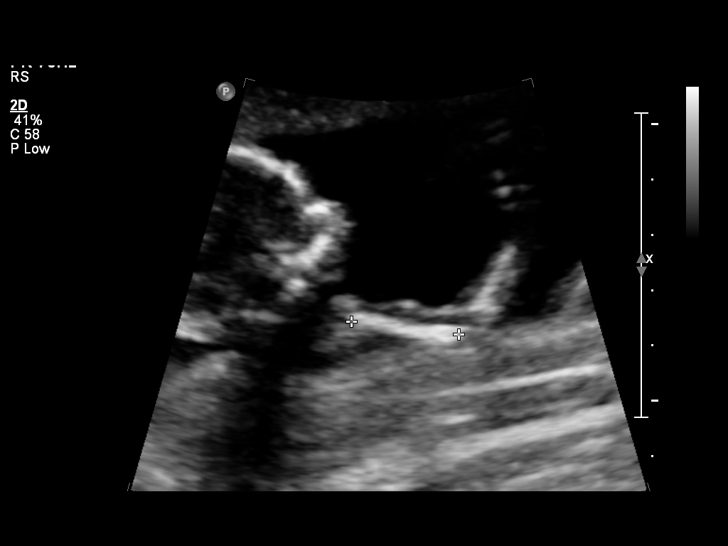
[im 54/67]
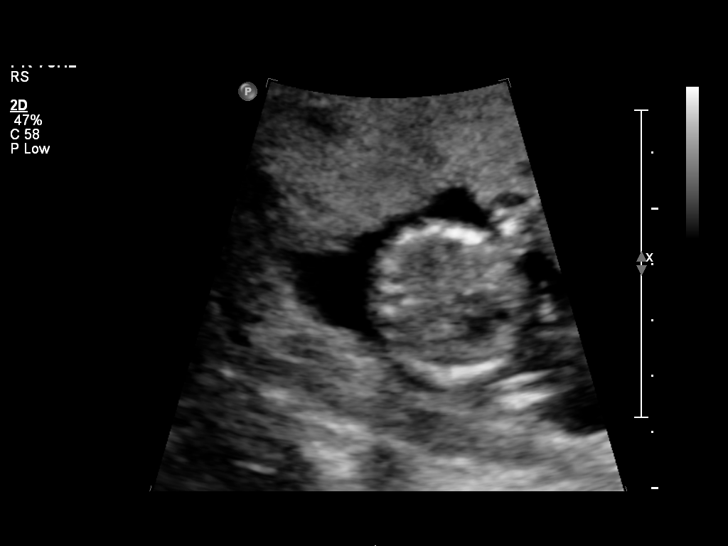
[im 59/67]
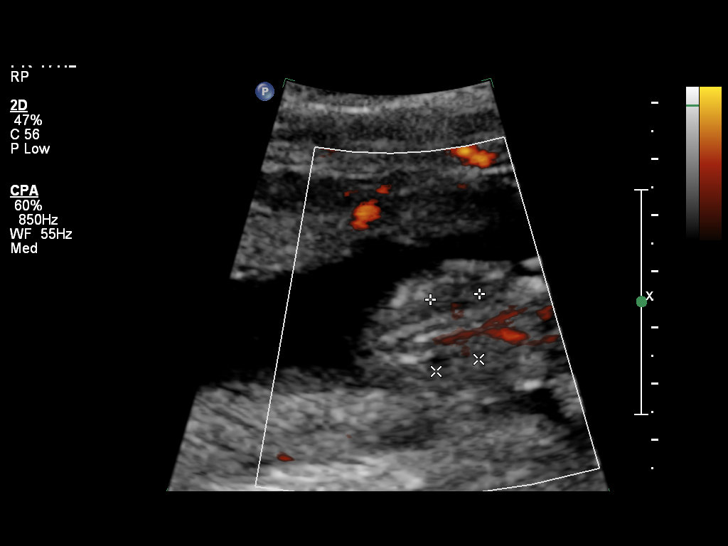
[im 64/67]
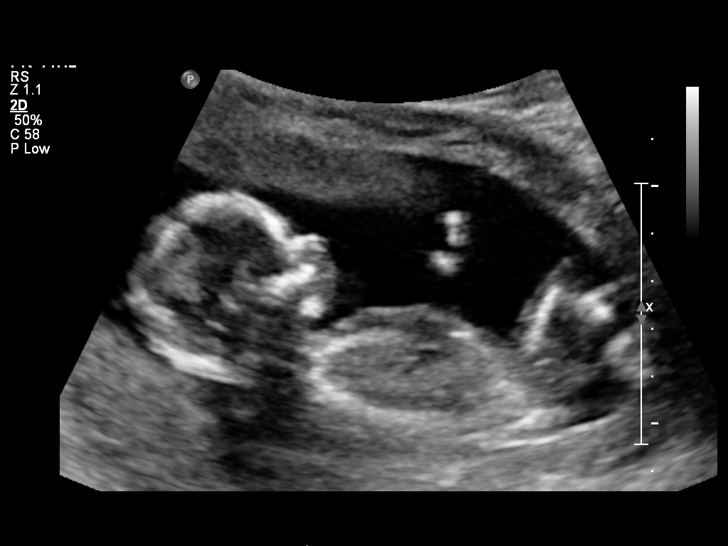

[12 of 28 positions shown; findings below may reference images not displayed]

OBSTETRICS REPORT
                      (Signed Final 02/07/2013 [DATE])

Service(s) Provided

 US OB DETAIL + 14 WK                                  76811.0
Indications

 Size-Date Discrepancy
 Unsure of LMP;  Establish Gestational [AGE]
Fetal Evaluation

 Num Of Fetuses:    1
 Fetal Heart Rate:  155                         bpm
 Cardiac Activity:  Observed
 Presentation:      Breech
 Placenta:          Anterior, above cervical os
 P. Cord            Visualized, central
 Insertion:

 Amniotic Fluid
 AFI FV:      Subjectively within normal limits
                                             Larg Pckt:     4.7  cm
Biometry

 BPD:     29.3  mm    G. Age:   15w 2d                CI:        68.05   70 - 86
                                                      FL/HC:      16.5   13.3 -

 HC:     113.6  mm    G. Age:   15w 4d       32  %    HC/AC:      1.20   1.05 -

 AC:      94.5  mm    G. Age:   15w 4d       53  %    FL/BPD:
 FL:      18.8  mm    G. Age:   15w 4d       46  %    FL/AC:      19.9   20 - 24
 HUM:     19.5  mm    G. Age:   15w 5d       59  %

 Est. FW:     128  gm      0 lb 5 oz     72  %
Gestational Age

 U/S Today:     15w 4d                                        EDD:   07/28/13
 Best:          15w 4d    Det. By:   U/S (02/07/13)           EDD:   07/28/13
Anatomy

 Cranium:          Appears normal         Aortic Arch:      Not well visualized
 Fetal Cavum:      Appears normal         Ductal Arch:      Not well visualized
 Ventricles:       Appears normal         Diaphragm:        Appears normal
 Choroid Plexus:   Appears normal         Stomach:          Appears normal, left
                                                            sided
 Cerebellum:       Not well visualized    Abdomen:          Appears normal
 Posterior Fossa:  Not well visualized    Abdominal Wall:   Appears nml (cord
                                                            insert, abd wall)
 Nuchal Fold:      Not well visualized    Cord Vessels:     Appears normal (3
                                                            vessel cord)
 Face:             Profile appears        Kidneys:          Appear normal
                   normal
 Lips:             Not well visualized    Bladder:          Appears normal
 Heart:            Not well visualized    Spine:            Not well visualized
 RVOT:             Not well visualized    Lower             Appears normal
                                          Extremities:
 LVOT:             Not well visualized    Upper             Appears normal
                                          Extremities:

 Other:  Female gender. Technically difficult due to early GA. Technically
         difficult due to fetal position.
Cervix Uterus Adnexa

 Cervical Length:   3.5       cm

 Cervix:       Normal appearance by transabdominal scan.
 Uterus:       No abnormality visualized.
 Left Ovary:   No adnexal mass visualized.
 Right Ovary:  No adnexal mass visualized.

 Adnexa:     No abnormality visualized.
Impression

 Single living IUP with US Gest. Age of 15w 4d, and EDD of
 07/28/2013.
 Suboptimal evaluation of anatomy due to early GA, but no
 early fetal anomalies visualized.
Recommendations

 US for fetal anatomic evaluation at 18-19 wks GA.

 questions or concerns.

## 2015-04-25 ENCOUNTER — Emergency Department (HOSPITAL_COMMUNITY)
Admission: EM | Admit: 2015-04-25 | Discharge: 2015-04-25 | Disposition: A | Discharge status: Home / Self Care | Payer: Medicaid Other | Source: Home / Self Care | Attending: Family Medicine | Admitting: Family Medicine

## 2015-04-25 ENCOUNTER — Encounter (HOSPITAL_COMMUNITY): Payer: Self-pay | Admitting: Emergency Medicine

## 2015-04-25 DIAGNOSIS — R509 Fever, unspecified: Secondary | ICD-10-CM | POA: Diagnosis not present

## 2015-04-25 DIAGNOSIS — R1084 Generalized abdominal pain: Secondary | ICD-10-CM | POA: Diagnosis not present

## 2015-04-25 LAB — URINE MICROSCOPIC-ADD ON

## 2015-04-25 LAB — URINALYSIS, ROUTINE W REFLEX MICROSCOPIC
Bilirubin Urine: NEGATIVE
Glucose, UA: NEGATIVE mg/dL
Ketones, ur: 15 mg/dL — AB
Nitrite: NEGATIVE
PROTEIN: 100 mg/dL — AB
Specific Gravity, Urine: 1.02 (ref 1.005–1.030)
Urobilinogen, UA: 1 mg/dL (ref 0.0–1.0)
pH: 5.5 (ref 5.0–8.0)

## 2015-04-25 LAB — POCT URINALYSIS DIP (DEVICE)
Bilirubin Urine: NEGATIVE
GLUCOSE, UA: NEGATIVE mg/dL
Ketones, ur: NEGATIVE mg/dL
NITRITE: NEGATIVE
SPECIFIC GRAVITY, URINE: 1.02 (ref 1.005–1.030)
UROBILINOGEN UA: 1 mg/dL (ref 0.0–1.0)
pH: 6 (ref 5.0–8.0)

## 2015-04-25 LAB — POCT PREGNANCY, URINE: Preg Test, Ur: NEGATIVE

## 2015-04-25 MED ORDER — ONDANSETRON 4 MG PO TBDP
ORAL_TABLET | ORAL | Status: AC
  Filled 2015-04-25: qty 1

## 2015-04-25 MED ORDER — ONDANSETRON HCL 4 MG PO TABS: 4.0000 mg | ORAL_TABLET | Freq: Three times a day (TID) | ORAL | Status: DC | PRN

## 2015-04-25 NOTE — ED Provider Notes (Signed)
CSN: [id number]     Arrival date & time 04/25/15  [id number] History   First MD Initiated Contact with Patient 04/25/15 [phone]     Chief Complaint  Patient presents with  . Influenza   (Consider location/radiation/quality/duration/timing/severity/associated sxs/prior Treatment) HPI  7 days ago developed diarrhea and fever and nausea. Initially improved after 3-4 days but worsened again 1 day ago. nasuea and diarrhea after nearly every meal. Non-bloody and non-bilious emesis. Associate it with headache, and general malaise. Diarrhea improving.  Denies sore throat, cough, congestion, SOB, SP, palpitations, dysuria, frequency, back pain.  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  LMP 04/22/18  Past Medical History  Diagnosis Date  . Anemia    History reviewed. No pertinent past surgical history. Family History  Problem Relation Age of Onset  . Diabetes Mother   . Hyperlipidemia Mother   . Hypertension Mother   . Hypertension Father   . Hyperlipidemia Father   . Diabetes Father    History  Substance Use Topics  . Smoking status: Never Smoker   . Smokeless tobacco: Not on file  . Alcohol Use: No   OB History    Gravida Para Term Preterm AB TAB SAB Ectopic Multiple Living   1 1 1  0 0 0 0 0 0 1     Review of Systems Per HPI with all other pertinent systems negative.   Allergies  Amoxicillin  Home Medications   Prior to Admission medications   Medication Sig Start Date End Date Taking? Authorizing Provider  ibuprofen (ADVIL,MOTRIN) 600 MG tablet Take 1 tablet (600 mg total) by mouth every 6 (six) hours as needed for pain (pain scale < 4). 07/24/13  Yes Brock Badharles A Harper, MD  Iron-FA-B Cmp-C-Biot-Probiotic (FUSION PLUS) CAPS Take 1 capsule by mouth daily before breakfast. 07/24/13   Brock Badharles A Harper, MD  ondansetron (ZOFRAN) 4 MG tablet Take 1 tablet (4 mg total) by mouth every 8  (eight) hours as needed for nausea or vomiting. 04/25/15   Ozella Rocksavid J Merrell, MD   BP 145/94 mmHg  Pulse 126  Temp(Src) 101.5 F (38.6 C) (Oral)  Resp 16  SpO2 98%  LMP 04/25/2015  Breastfeeding? No Physical Exam Physical Exam  Constitutional: oriented to person, place, and time. appears well-developed and well-nourished. No distress.  HENT:  Head: Normocephalic and atraumatic.  Eyes: EOMI. PERRL.  Neck: Normal range of motion.  Cardiovascular: RRR, no m/r/g, 2+ distal pulses,  Pulmonary/Chest: Effort normal and breath sounds normal. No respiratory distress.  Abdominal: Soft. Bowel sounds are normal. NonTTP, no distension.  Musculoskeletal: Normal range of motion. Non ttp, no effusion.  Neurological: alert and oriented to person, place, and time.  Skin: Skin is warm. No rash noted. non diaphoretic.  Psychiatric: normal mood and affect. behavior is normal. Judgment and thought content normal.   ED Course  Procedures (including critical care time) Labs Review Labs Reviewed  POCT URINALYSIS DIP (DEVICE) - Abnormal; Notable for the following:    Hgb urine dipstick LARGE (*)    Protein, ur >=300 (*)    Leukocytes, UA SMALL (*)    All other components within normal limits  URINE CULTURE  URINALYSIS, ROUTINE W REFLEX MICROSCOPIC  POCT PREGNANCY, URINE    Imaging Review No results found.   MDM   1. Generalized abdominal pain   2. Febrile illness    Suspect viral gastroenteritis. Patient was suspicious history  given the fact that she works at [employer] day[employer] and started this job recently. Urine likely contaminated by vaginal bleeding from menstrual. Will send for urine micro-and culture. Prescription for Zofran provided. Patient to get plenty of rest and drink lots of fluids. Go to the emergency room if gets worse. Motrin 800 mg and Zofran 4 mg given to patient. Tachycardia noted and patient likely dehydrated but otherwise very well-appearing.    Ozella Rocksavid J Merrell, MD 04/25/15  [phone]

## 2015-04-25 NOTE — ED Notes (Signed)
Reports last Friday having vomiting. Cold chills. Body aches. Fever. And diarrhea.   Symptoms lasted through out the weekend and then subsided.   Pt state that symptoms returned this past Wednesday and gradually getting worse.   No relief with otc meds.

## 2015-04-25 NOTE — Discharge Instructions (Signed)
He likely her suffering from a viral illness called gastroenteritis. This should resolve in another few days. Please use the Zofran for nausea. Please use ibuprofen and Tylenol for fever and pain relief. Please stay well hydrated and get plenty of rest.

## 2015-04-26 NOTE — ED Notes (Signed)
Urine culture: >100,000 colonies E. Coli preliminary report.  Message sent to Dr. Konrad DoloresMerrell. Vassie MoselleYork, Tiffane Sheldon M 04/26/2015

## 2015-04-27 LAB — URINE CULTURE

## 2015-04-29 ENCOUNTER — Telehealth (HOSPITAL_COMMUNITY): Payer: Self-pay | Admitting: Family Medicine

## 2015-04-29 NOTE — Telephone Encounter (Signed)
-----   Message from Vassie MoselleSuzanne M York, RN sent at 04/26/2015  3:07 PM EDT ----- Regarding: lab >100,000 colonies E. Coli preliminary on culture. Did not see any antibiotics ordered. Vassie MoselleYork, Suzanne M 04/26/2015

## 2015-04-29 NOTE — ED Notes (Signed)
Called pt. Symptoms have completely resolved and feels back at baseline. Told to call if any symptoms return to call for treatment.   Shelly Flattenavid Dnyla Antonetti, MD Family Medicine 04/29/2015, 8:49 PM    Ozella Rocksavid J Cahterine Heinzel, MD 04/29/15 2049

## 2015-05-10 NOTE — ED Notes (Signed)
MD reviewed w patient

## 2015-06-09 ENCOUNTER — Encounter (HOSPITAL_COMMUNITY): Payer: Self-pay

## 2015-06-09 ENCOUNTER — Emergency Department (HOSPITAL_COMMUNITY): Payer: Medicaid Other

## 2015-06-09 ENCOUNTER — Inpatient Hospital Stay (HOSPITAL_COMMUNITY)
Admission: EM | Admit: 2015-06-09 | Discharge: 2015-06-11 | DRG: 872 | Disposition: A | Discharge status: Home / Self Care | Payer: Medicaid Other | Attending: Internal Medicine | Admitting: Internal Medicine

## 2015-06-09 DIAGNOSIS — D509 Iron deficiency anemia, unspecified: Secondary | ICD-10-CM | POA: Diagnosis present

## 2015-06-09 DIAGNOSIS — A4151 Sepsis due to Escherichia coli [E. coli]: Secondary | ICD-10-CM | POA: Diagnosis not present

## 2015-06-09 DIAGNOSIS — R000 Tachycardia, unspecified: Secondary | ICD-10-CM | POA: Diagnosis present

## 2015-06-09 DIAGNOSIS — N12 Tubulo-interstitial nephritis, not specified as acute or chronic: Secondary | ICD-10-CM | POA: Diagnosis present

## 2015-06-09 DIAGNOSIS — E669 Obesity, unspecified: Secondary | ICD-10-CM | POA: Diagnosis present

## 2015-06-09 DIAGNOSIS — Z79899 Other long term (current) drug therapy: Secondary | ICD-10-CM

## 2015-06-09 DIAGNOSIS — Z833 Family history of diabetes mellitus: Secondary | ICD-10-CM | POA: Diagnosis not present

## 2015-06-09 DIAGNOSIS — Z6832 Body mass index (BMI) 32.0-32.9, adult: Secondary | ICD-10-CM | POA: Diagnosis not present

## 2015-06-09 DIAGNOSIS — R509 Fever, unspecified: Secondary | ICD-10-CM | POA: Diagnosis present

## 2015-06-09 DIAGNOSIS — Z880 Allergy status to penicillin: Secondary | ICD-10-CM

## 2015-06-09 DIAGNOSIS — E876 Hypokalemia: Secondary | ICD-10-CM | POA: Diagnosis present

## 2015-06-09 DIAGNOSIS — A419 Sepsis, unspecified organism: Principal | ICD-10-CM | POA: Diagnosis present

## 2015-06-09 DIAGNOSIS — Z8249 Family history of ischemic heart disease and other diseases of the circulatory system: Secondary | ICD-10-CM | POA: Diagnosis not present

## 2015-06-09 DIAGNOSIS — M549 Dorsalgia, unspecified: Secondary | ICD-10-CM | POA: Diagnosis present

## 2015-06-09 DIAGNOSIS — R109 Unspecified abdominal pain: Secondary | ICD-10-CM | POA: Diagnosis present

## 2015-06-09 DIAGNOSIS — I471 Supraventricular tachycardia: Secondary | ICD-10-CM | POA: Diagnosis not present

## 2015-06-09 LAB — URINALYSIS, ROUTINE W REFLEX MICROSCOPIC
Bilirubin Urine: NEGATIVE
GLUCOSE, UA: NEGATIVE mg/dL
KETONES UR: NEGATIVE mg/dL
Nitrite: NEGATIVE
PROTEIN: NEGATIVE mg/dL
Specific Gravity, Urine: 1.009 (ref 1.005–1.030)
UROBILINOGEN UA: 1 mg/dL (ref 0.0–1.0)
pH: 7.5 (ref 5.0–8.0)

## 2015-06-09 LAB — COMPREHENSIVE METABOLIC PANEL
ALK PHOS: 76 U/L (ref 38–126)
ALT: 15 U/L (ref 14–54)
AST: 31 U/L (ref 15–41)
Albumin: 3.1 g/dL — ABNORMAL LOW (ref 3.5–5.0)
Anion gap: 13 (ref 5–15)
BILIRUBIN TOTAL: 0.8 mg/dL (ref 0.3–1.2)
BUN: 9 mg/dL (ref 6–20)
CHLORIDE: 103 mmol/L (ref 101–111)
CO2: 22 mmol/L (ref 22–32)
CREATININE: 1.37 mg/dL — AB (ref 0.44–1.00)
Calcium: 8.7 mg/dL — ABNORMAL LOW (ref 8.9–10.3)
GFR calc Af Amer: >60 mL/min (ref >60)
GFR, EST NON AFRICAN AMERICAN: 54 mL/min — AB (ref >60)
GLUCOSE: 144 mg/dL — AB (ref 65–99)
Potassium: 3 mmol/L — ABNORMAL LOW (ref 3.5–5.1)
SODIUM: 138 mmol/L (ref 135–145)
Total Protein: 8.7 g/dL — ABNORMAL HIGH (ref 6.5–8.1)

## 2015-06-09 LAB — I-STAT CHEM 8, ED
BUN: 7 mg/dL (ref 6–20)
CALCIUM ION: 1.06 mmol/L — AB (ref 1.12–1.23)
CHLORIDE: 103 mmol/L (ref 101–111)
Creatinine, Ser: 1.3 mg/dL — ABNORMAL HIGH (ref 0.44–1.00)
GLUCOSE: 150 mg/dL — AB (ref 65–99)
HEMATOCRIT: 27 % — AB (ref 36.0–46.0)
HEMOGLOBIN: 9.2 g/dL — AB (ref 12.0–15.0)
POTASSIUM: 3 mmol/L — AB (ref 3.5–5.1)
SODIUM: 138 mmol/L (ref 135–145)
TCO2: 21 mmol/L (ref 0–100)

## 2015-06-09 LAB — CBC WITH DIFFERENTIAL/PLATELET
BASOS ABS: 0 10*3/uL (ref 0.0–0.1)
Basophils Relative: 0 % (ref 0–1)
EOS PCT: 0 % (ref 0–5)
Eosinophils Absolute: 0 10*3/uL (ref 0.0–0.7)
HEMATOCRIT: 24.5 % — AB (ref 36.0–46.0)
Hemoglobin: 7.3 g/dL — ABNORMAL LOW (ref 12.0–15.0)
LYMPHS PCT: 6 % — AB (ref 12–46)
Lymphs Abs: 1.9 10*3/uL (ref 0.7–4.0)
MCH: 19.5 pg — ABNORMAL LOW (ref 26.0–34.0)
MCHC: 29.8 g/dL — ABNORMAL LOW (ref 30.0–36.0)
MCV: 65.3 fL — ABNORMAL LOW (ref 78.0–100.0)
MONO ABS: 4 10*3/uL — AB (ref 0.1–1.0)
Monocytes Relative: 13 % — ABNORMAL HIGH (ref 3–12)
NEUTROS PCT: 81 % — AB (ref 43–77)
Neutro Abs: 25.2 10*3/uL — ABNORMAL HIGH (ref 1.7–7.7)
Platelets: 570 10*3/uL — ABNORMAL HIGH (ref 150–400)
RBC: 3.75 MIL/uL — ABNORMAL LOW (ref 3.87–5.11)
RDW: 20 % — ABNORMAL HIGH (ref 11.5–15.5)
WBC: 31.1 10*3/uL — ABNORMAL HIGH (ref 4.0–10.5)

## 2015-06-09 LAB — URINE MICROSCOPIC-ADD ON

## 2015-06-09 LAB — I-STAT CG4 LACTIC ACID, ED: LACTIC ACID, VENOUS: 1.99 mmol/L (ref 0.5–2.0)

## 2015-06-09 MED ORDER — ONDANSETRON HCL 4 MG/2ML IJ SOLN
4.0000 mg | Freq: Four times a day (QID) | INTRAMUSCULAR | Status: DC | PRN
  Administered 2015-06-10: 4 mg via INTRAVENOUS
  Filled 2015-06-09: qty 2

## 2015-06-09 MED ORDER — CEFTRIAXONE SODIUM IN DEXTROSE 20 MG/ML IV SOLN
1.0000 g | INTRAVENOUS | Status: DC
  Administered 2015-06-10: 1 g via INTRAVENOUS
  Filled 2015-06-09 (×2): qty 50

## 2015-06-09 MED ORDER — SODIUM CHLORIDE 0.9 % IV SOLN
INTRAVENOUS | Status: DC
  Administered 2015-06-09 – 2015-06-11 (×2): via INTRAVENOUS

## 2015-06-09 MED ORDER — OXYCODONE HCL 5 MG PO TABS
5.0000 mg | ORAL_TABLET | ORAL | Status: DC | PRN
  Administered 2015-06-10: 5 mg via ORAL
  Filled 2015-06-09: qty 1

## 2015-06-09 MED ORDER — DEXTROSE 5 % IV SOLN
1.0000 g | Freq: Once | INTRAVENOUS | Status: AC
  Administered 2015-06-09: 1 g via INTRAVENOUS
  Filled 2015-06-09: qty 10

## 2015-06-09 MED ORDER — IOHEXOL 300 MG/ML  SOLN
50.0000 mL | Freq: Once | INTRAMUSCULAR | Status: AC | PRN
Start: 2015-06-09 — End: 2015-06-09
  Administered 2015-06-09: 50 mL via ORAL

## 2015-06-09 MED ORDER — ENOXAPARIN SODIUM 40 MG/0.4ML ~~LOC~~ SOLN
40.0000 mg | SUBCUTANEOUS | Status: DC
  Administered 2015-06-10: 40 mg via SUBCUTANEOUS
  Filled 2015-06-09 (×3): qty 0.4

## 2015-06-09 MED ORDER — SODIUM CHLORIDE 0.9 % IV BOLUS (SEPSIS)
1000.0000 mL | Freq: Once | INTRAVENOUS | Status: AC
  Administered 2015-06-09: 1000 mL via INTRAVENOUS

## 2015-06-09 MED ORDER — ONDANSETRON HCL 4 MG PO TABS
4.0000 mg | ORAL_TABLET | Freq: Four times a day (QID) | ORAL | Status: DC | PRN
Start: 2015-06-09 — End: 2015-06-11

## 2015-06-09 MED ORDER — HYDROMORPHONE HCL 1 MG/ML IJ SOLN
0.5000 mg | INTRAMUSCULAR | Status: DC | PRN
  Administered 2015-06-10: 0.5 mg via INTRAVENOUS
  Filled 2015-06-09: qty 1

## 2015-06-09 MED ORDER — ACETAMINOPHEN 325 MG PO TABS
650.0000 mg | ORAL_TABLET | Freq: Once | ORAL | Status: AC
  Administered 2015-06-09: 650 mg via ORAL
  Filled 2015-06-09: qty 2

## 2015-06-09 MED ORDER — ACETAMINOPHEN 650 MG RE SUPP
650.0000 mg | Freq: Four times a day (QID) | RECTAL | Status: DC | PRN
Start: 2015-06-09 — End: 2015-06-11

## 2015-06-09 MED ORDER — ACETAMINOPHEN 325 MG PO TABS
650.0000 mg | ORAL_TABLET | Freq: Four times a day (QID) | ORAL | Status: DC | PRN
  Administered 2015-06-09 – 2015-06-10 (×3): 650 mg via ORAL
  Filled 2015-06-09 (×4): qty 2

## 2015-06-09 MED ORDER — IOHEXOL 300 MG/ML  SOLN
100.0000 mL | Freq: Once | INTRAMUSCULAR | Status: AC | PRN
  Administered 2015-06-09: 100 mL via INTRAVENOUS

## 2015-06-09 MED ORDER — ALUM & MAG HYDROXIDE-SIMETH 200-200-20 MG/5ML PO SUSP
30.0000 mL | Freq: Four times a day (QID) | ORAL | Status: DC | PRN
  Administered 2015-06-10: 30 mL via ORAL
  Filled 2015-06-09: qty 30

## 2015-06-09 MED ORDER — ADULT MULTIVITAMIN W/MINERALS CH
1.0000 | ORAL_TABLET | Freq: Every day | ORAL | Status: DC
  Administered 2015-06-10 – 2015-06-11 (×2): 1 via ORAL
  Filled 2015-06-09 (×3): qty 1

## 2015-06-09 NOTE — ED Notes (Signed)
Rectal temp 103.7, MD notified.

## 2015-06-09 NOTE — ED Notes (Signed)
Unable to collect labs at this time patient is in xray 

## 2015-06-09 NOTE — ED Notes (Signed)
Provided pt with sandwich and water after MD approved PO intake.

## 2015-06-09 NOTE — ED Notes (Signed)
This RN unsuccessfully attempted x 3 to get a temperature.  Primary RN to attempt a rectal temp.  MD made aware.

## 2015-06-09 NOTE — H&P (Addendum)
[facility] Admission History and Physical       Nicole Patel HQI:[id number] DOB: 26-Aug-1991 DOA: 06/09/2015  Referring physician: EDP PCP: No primary care provider on file.  Specialists:   Chief Complaint: Fever chills and Back Pain  HPI: Nicole Patel is a 24 y.o. female who presents to the ED with complaints of Fevers chills  ABD and Flank pain X 1 Week.   She reports some nausea but no vomiting.    She was seen at the [facility] 04/27/2015 and a Urine culture was sent and the results were positive for Pan-sensitive E.Coli. In the ED today, she was found to have a fever to 103, and tachycardic to 168.   Blood Cultures and a Urine Culture were sent and a CT scan of the ABD was performed which revealed Bilateral Pyelonephritis.  She was administered IVFs and IV Rocephin and improved and  Was referred for medical admission.      Review of Systems: Constitutional: No Weight Loss, No Weight Gain, Night Sweats, +Fevers, +Chills, Dizziness, Light Headedness, Fatigue, or Generalized Weakness HEENT: No Headaches, Difficulty Swallowing,Tooth/Dental Problems,Sore Throat,  No Sneezing, Rhinitis, Ear Ache, Nasal Congestion, or Post Nasal Drip,  Cardio-vascular:  No Chest pain, Orthopnea, PND, Edema in Lower Extremities, Anasarca, Dizziness, Palpitations  Resp: No Dyspnea, No DOE, No Productive Cough, No Non-Productive Cough, No Hemoptysis, No Wheezing.    GI: No Heartburn, Indigestion, Abdominal Pain, Nausea, Vomiting, Diarrhea, Constipation, Hematemesis, Hematochezia, Melena, Change in Bowel Habits,  Loss of Appetite  GU: No Dysuria, No Change in Color of Urine, No Urgency or Urinary Frequency, +Flank pain.  Musculoskeletal: No Joint Pain or Swelling, No Decreased Range of Motion, No Back Pain.  Neurologic: No Syncope, No Seizures, Muscle Weakness, Paresthesia, Vision Disturbance or Loss, No Diplopia, No Vertigo, No Difficulty Walking,  Skin: No Rash or Lesions. Psych: No Change in  Mood or Affect, No Depression or Anxiety, No Memory loss, No Confusion, or Hallucinations   Past Medical History  Diagnosis Date  . Anemia      History reviewed. No pertinent past surgical history.    Prior to Admission medications   Medication Sig Start Date End Date Taking? Authorizing Provider  acetaminophen (TYLENOL) 325 MG tablet Take 650 mg by mouth every 6 (six) hours as needed for moderate pain.   Yes Historical Provider, MD  Iron-FA-B Cmp-C-Biot-Probiotic (FUSION PLUS) CAPS Take 1 capsule by mouth daily before breakfast. Patient taking differently: Take 1 capsule by mouth 2 (two) times a week.  07/24/13  Yes Brock Bad, MD  ondansetron (ZOFRAN) 4 MG tablet Take 1 tablet (4 mg total) by mouth every 8 (eight) hours as needed for nausea or vomiting. Patient not taking: Reported on 06/09/2015 04/25/15   Ozella Rocks, MD     Allergies  Allergen Reactions  . Amoxicillin Hives and Itching    Social History:  reports that she has never smoked. She does not have any smokeless tobacco history on file. She reports that she does not drink alcohol or use illicit drugs.    Family History  Problem Relation Age of Onset  . Diabetes Mother   . Hyperlipidemia Mother   . Hypertension Mother   . Hypertension Father   . Hyperlipidemia Father   . Diabetes Father        Physical Exam:  GEN:  Pleasant Obese  24 y.o. African American female examined and in no acute distress; cooperative with exam Filed Vitals:   06/09/15  1701 06/09/15 1734 06/09/15 1835 06/09/15 1910  BP: 121/72 127/76 122/71 113/64  Pulse: 113 106 102 98  Temp: 100.3 F (37.9 C) 100.5 F (38.1 C)  99 F (37.2 C)  TempSrc: Oral Oral  Oral  Resp: 20 25 23 21   SpO2: 99% 100% 99% 100%   Blood pressure 113/64, pulse 98, temperature 99 F (37.2 C), temperature source Oral, resp. rate 21, last menstrual period 06/06/2015, SpO2 100 %, not currently breastfeeding. PSYCH: She is alert and oriented x4; does not  appear anxious does not appear depressed; affect is normal HEENT: Normocephalic and Atraumatic, Mucous membranes pink; PERRLA; EOM intact; Fundi:  Benign;  No scleral icterus, Nares: Patent, Oropharynx: Clear, Fair Dentition,    Neck:  FROM, No Cervical Lymphadenopathy nor Thyromegaly or Carotid Bruit; No JVD; Breasts:: Not examined CHEST WALL: No tenderness CHEST: Normal respiration, clear to auscultation bilaterally HEART: Regular rate and rhythm; no murmurs rubs or gallops BACK: No kyphosis or scoliosis; No CVA tenderness ABDOMEN: Positive Bowel Sounds, Obese, Soft Non-Tender, No Rebound or Guarding; No Masses, No Organomegaly. Rectal Exam: Not done EXTREMITIES: No Cyanosis, Clubbing, or Edema; No Ulcerations. Genitalia: not examined PULSES: 2+ and symmetric SKIN: Normal hydration no rash or ulceration CNS:  Alert and Oriented x 4, No Focal Deficits Vascular: pulses palpable throughout    Labs on Admission:  Basic Metabolic Panel:  Recent Labs Lab 06/09/15 1457 06/09/15 1504  NA 138 138  K 3.0* 3.0*  CL 103 103  CO2 22  --   GLUCOSE 144* 150*  BUN 9 7  CREATININE 1.37* 1.30*  CALCIUM 8.7*  --    Liver Function Tests:  Recent Labs Lab 06/09/15 1457  AST 31  ALT 15  ALKPHOS 76  BILITOT 0.8  PROT 8.7*  ALBUMIN 3.1*   No results for input(s): LIPASE, AMYLASE in the last 168 hours. No results for input(s): AMMONIA in the last 168 hours. CBC:  Recent Labs Lab 06/09/15 1457 06/09/15 1504  WBC 31.1*  --   NEUTROABS 25.2*  --   HGB 7.3* 9.2*  HCT 24.5* 27.0*  MCV 65.3*  --   PLT 570*  --    Cardiac Enzymes: No results for input(s): CKTOTAL, CKMB, CKMBINDEX, TROPONINI in the last 168 hours.  BNP (last 3 results) No results for input(s): BNP in the last 8760 hours.  ProBNP (last 3 results) No results for input(s): PROBNP in the last 8760 hours.  CBG: No results for input(s): GLUCAP in the last 168 hours.  Radiological Exams on Admission:    EKG:  Independently reviewed. Sinus Tachycardia rate =127   Assessment/Plan:  24 y.o. female with    Principal Problem:   1.    Pyelonephritis   Urine Culture Results from    IV Rocephin   IVFs     2.    Sepsis- due to #1   IV Abxs and IVFs     3.   Sinus Tachycardia- improving after IVFs     4.   Anemia-  With Microcytic Indices,  Most Likely due to Menses   Send Anemia Panel       5.   DVT Prophylaxis   Lovenox      Code Status:     FULL CODE       Family Communication:   Mother at Bedside     Disposition Plan:    Inpatient  Observation Status        Time spent:  5360  Minutes  Ron Parker [facility] Pager [phone]   If 7AM -7PM Please Contact the Day Rounding Team MD for [facility]  If 7PM-7AM, Please Contact Night-Floor Coverage  [email or web address] Password [email or web address] 06/09/2015, 7:32 PM     ADDENDUM:   Patient was seen and examined on 06/09/2015

## 2015-06-09 NOTE — ED Notes (Signed)
Pt c/o bilateral flank pain, emesis, and fever x over a month.  Pain score 10/10.  Denies dysuria.  Pt reports taking acetaminophen w/ "a little" relief.  Pt reports being diagnosed w/ an UTI when symptoms started, but Urgent Care did not prescribe an antibiotic.

## 2015-06-09 NOTE — ED Provider Notes (Signed)
CSN: [id number]     Arrival date & time 06/09/15  1421 History   First MD Initiated Contact with Patient 06/09/15 1440     Chief Complaint  Patient presents with  . Flank Pain  . Emesis  . Fever     (Consider location/radiation/quality/duration/timing/severity/associated sxs/prior Treatment) Patient is a 24 y.o. female presenting with flank pain, vomiting, and fever. The history is provided by the patient.  Flank Pain Pertinent negatives include no chest pain, no abdominal pain, no headaches and no shortness of breath.  Emesis Associated symptoms: no abdominal pain, no diarrhea and no headaches   Fever Associated symptoms: cough and vomiting   Associated symptoms: no chest pain, no diarrhea, no dysuria, no headaches, no nausea and no rash    patient presents with fevers and low back pain. Also left flank pain. Around a month and half ago seen in urgent care. She states was called later and told that she had a urinary tract infection but was not prescribed an antibiotics. Since then she's been having fevers around twice a week. No nausea vomiting. States she's felt weak. The pain is in her lower back and goes to left flank area. She's been worse the last few days. States fevers but worse the last 3 days. No diarrhea. No nausea but mild vomiting. She has had occasional cough.  Past Medical History  Diagnosis Date  . Anemia    History reviewed. No pertinent past surgical history. Family History  Problem Relation Age of Onset  . Diabetes Mother   . Hyperlipidemia Mother   . Hypertension Mother   . Hypertension Father   . Hyperlipidemia Father   . Diabetes Father    History  Substance Use Topics  . Smoking status: Never Smoker   . Smokeless tobacco: Not on file  . Alcohol Use: No   OB History    Gravida Para Term Preterm AB TAB SAB Ectopic Multiple Living   0 0 0 0 0 0 1     Review of Systems  Constitutional: Positive for fever, appetite change and fatigue. Negative  for activity change.  Eyes: Negative for pain.  Respiratory: Positive for cough. Negative for chest tightness and shortness of breath.   Cardiovascular: Negative for chest pain and leg swelling.  Gastrointestinal: Positive for vomiting. Negative for nausea, abdominal pain and diarrhea.  Genitourinary: Positive for flank pain. Negative for dysuria, hematuria and pelvic pain.  Musculoskeletal: Positive for back pain. Negative for neck stiffness.  Skin: Negative for rash.  Neurological: Negative for weakness, numbness and headaches.  Psychiatric/Behavioral: Negative for behavioral problems.      Allergies  Amoxicillin  Home Medications   Prior to Admission medications   Medication Sig Start Date End Date Taking? Authorizing Provider  acetaminophen (TYLENOL) 325 MG tablet Take 650 mg by mouth every 6 (six) hours as needed for moderate pain.   Yes Historical Provider, MD  Iron-FA-B Cmp-C-Biot-Probiotic (FUSION PLUS) CAPS Take 1 capsule by mouth daily before breakfast. Patient taking differently: Take 1 capsule by mouth 2 (two) times a week.  07/24/13  Yes Brock Bad, MD  ondansetron (ZOFRAN) 4 MG tablet Take 1 tablet (4 mg total) by mouth every 8 (eight) hours as needed for nausea or vomiting. Patient not taking: Reported on 06/09/2015 04/25/15   Ozella Rocks, MD   BP 103/59 mmHg  Pulse 126  Temp(Src) 103.7 F (39.8 C) (Rectal)  Resp 18  SpO2 100%  LMP 06/06/2015 Physical Exam  Constitutional: She appears well-developed.  HENT:  Head: Normocephalic.  Eyes: Pupils are equal, round, and reactive to light.  Cardiovascular:  Tachycardia.  Pulmonary/Chest: Effort normal.  Abdominal: There is tenderness.  Some tenderness and left flank with deep palpation  Genitourinary:  Some low back tenderness bilaterally, CVA tenderness on the left.  Neurological: She is alert.  Skin: Skin is warm.  Psychiatric:  Patient states she is very anxious about the needles now    ED Course   Procedures (including critical care time) Labs Review Labs Reviewed  CBC WITH DIFFERENTIAL/PLATELET - Abnormal; Notable for the following:    WBC 31.1 (*)    RBC 3.75 (*)    Hemoglobin 7.3 (*)    HCT 24.5 (*)    MCV 65.3 (*)    MCH 19.5 (*)    MCHC 29.8 (*)    RDW 20.0 (*)    Platelets 570 (*)    All other components within normal limits  I-STAT CHEM 8, ED - Abnormal; Notable for the following:    Potassium 3.0 (*)    Creatinine, Ser 1.30 (*)    Glucose, Bld 150 (*)    Calcium, Ion 1.06 (*)    Hemoglobin 9.2 (*)    HCT 27.0 (*)    All other components within normal limits  CULTURE, BLOOD (ROUTINE X 2)  CULTURE, BLOOD (ROUTINE X 2)  URINE CULTURE  COMPREHENSIVE METABOLIC PANEL  URINALYSIS, ROUTINE W REFLEX MICROSCOPIC (NOT [facility])  I-STAT CG4 LACTIC ACID, ED    Imaging Review Dg Chest 2 View  06/09/2015   CLINICAL DATA:  Fever and dry cough for 2 months  EXAM: CHEST  2 VIEW  COMPARISON:  None.  FINDINGS: The heart size and mediastinal contours are within normal limits. There is no focal infiltrate, pulmonary edema, or pleural effusion. The visualized skeletal structures are unremarkable.  IMPRESSION: No active cardiopulmonary disease.   Electronically Signed   By: Sherian ReinWei-Chen  Lin M.D.   On: 06/09/2015 15:08     EKG Interpretation None      MDM   Final diagnoses:  None    Patient with fevers. Elevated white count. Normal lactic acid. Had known urinary tract infection but was not given antibiotics. Reported more gastric symptoms at that time. Will get CT scan to evaluate for possible renal abscess or other intraluminal pathology.    Benjiman CoreNathan Cashlynn Yearwood, MD 06/09/15 [phone]

## 2015-06-10 DIAGNOSIS — A4151 Sepsis due to Escherichia coli [E. coli]: Secondary | ICD-10-CM

## 2015-06-10 DIAGNOSIS — I471 Supraventricular tachycardia: Secondary | ICD-10-CM

## 2015-06-10 DIAGNOSIS — E876 Hypokalemia: Secondary | ICD-10-CM | POA: Diagnosis present

## 2015-06-10 LAB — BASIC METABOLIC PANEL
Anion gap: 10 (ref 5–15)
BUN: 7 mg/dL (ref 6–20)
CALCIUM: 8.3 mg/dL — AB (ref 8.9–10.3)
CO2: 22 mmol/L (ref 22–32)
CREATININE: 1.02 mg/dL — AB (ref 0.44–1.00)
Chloride: 106 mmol/L (ref 101–111)
GFR calc Af Amer: >60 mL/min (ref >60)
GFR calc non Af Amer: >60 mL/min (ref >60)
GLUCOSE: 111 mg/dL — AB (ref 65–99)
POTASSIUM: 3.4 mmol/L — AB (ref 3.5–5.1)
Sodium: 138 mmol/L (ref 135–145)

## 2015-06-10 LAB — PREPARE RBC (CROSSMATCH)

## 2015-06-10 LAB — ABO/RH: ABO/RH(D): A POS

## 2015-06-10 MED ORDER — SODIUM CHLORIDE 0.9 % IV SOLN
Freq: Once | INTRAVENOUS | Status: AC
  Administered 2015-06-10: 09:00:00 via INTRAVENOUS

## 2015-06-10 MED ORDER — POTASSIUM CHLORIDE CRYS ER 20 MEQ PO TBCR
40.0000 meq | EXTENDED_RELEASE_TABLET | Freq: Once | ORAL | Status: AC
  Administered 2015-06-10: 40 meq via ORAL
  Filled 2015-06-10: qty 2

## 2015-06-10 MED ORDER — FERROUS SULFATE 325 (65 FE) MG PO TABS
325.0000 mg | ORAL_TABLET | Freq: Two times a day (BID) | ORAL | Status: DC
  Administered 2015-06-10 – 2015-06-11 (×3): 325 mg via ORAL
  Filled 2015-06-10 (×5): qty 1

## 2015-06-10 NOTE — Progress Notes (Signed)
Utilization review completed.  

## 2015-06-10 NOTE — Progress Notes (Signed)
CRITICAL VALUE ALERT  Critical value received:  Hemoglobin 6.2  Date of notification:  06/10/15  Time of notification:  0525  Critical value read back:Yes.    Nurse who received alert:  Mick SellShannon Deairra Halleck RN   MD notified (1st page):  Benedetto Coons. Callahan NP  Time of first page:  0531  MD notified (2nd page):  Time of second page:  Responding MD:  Benedetto Coons. Callahan NP  Time MD responded:

## 2015-06-10 NOTE — Progress Notes (Signed)
Ice applied to wrist,Lt.,states it hurts where they tried to put in an IV, in the ER yesterday.

## 2015-06-10 NOTE — Progress Notes (Signed)
Progress Note   Nicole Patel ZOX:[id number] DOB: [birth year] DOA: 06/09/2015 PCP: No primary care provider on file.   Brief Narrative:   Nicole Patel is an 24 y.o. female with no significant PMH who was admitted 06/09/15 with fever and tachycardia. CT of the abdomen done on admission showed bilateral pyelonephritis.  Assessment/Plan:   Principal Problem:   Sepsis secondary to Pyelonephritis - Continue empiric Rocephin and IV fluids. - Follow-up urine cultures. - Symptomatically improved. WBC slightly improved.  Active Problems:   Hypokalemia - Supplement.    Sinus tachycardia - Secondary to sepsis and anemia. Supportive care.    Microcytic anemia - Likely secondary to menstrual losses in this 24 year old female. - 1 unit of PRBCs given on admission. - Start iron supplement.    DVT Prophylaxis - Lovenox.  Family Communication: No family currently at the bedside. Disposition Plan: Home when culture and sensitivity data back, likely another 24-48 hours. Code Status:     Code Status Orders        Start     Ordered   06/09/15 2001  Full code   Continuous     06/09/15 2000        IV Access:    Peripheral IV   Procedures and diagnostic studies:   Dg Chest 2 View  06/09/2015   CLINICAL DATA:  Fever and dry cough for 2 months  EXAM: CHEST  2 VIEW  COMPARISON:  None.  FINDINGS: The heart size and mediastinal contours are within normal limits. There is no focal infiltrate, pulmonary edema, or pleural effusion. The visualized skeletal structures are unremarkable.  IMPRESSION: No active cardiopulmonary disease.   Electronically Signed   By: Sherian ReinWei-Chen  Lin M.D.   On: 06/09/2015 15:08   Ct Abdomen Pelvis W Contrast  06/09/2015   CLINICAL DATA:  Bilateral flank pain, emesis, and fever for over 1 month. 10/10 pain in the low back predominantly extending to the left flank area. Leukocytosis. Abnormal urinalysis.  EXAM: CT ABDOMEN AND PELVIS WITH CONTRAST  TECHNIQUE:  Multidetector CT imaging of the abdomen and pelvis was performed using the standard protocol following bolus administration of intravenous contrast.  CONTRAST:  50mL OMNIPAQUE IOHEXOL 300 MG/ML SOLN, 100mL OMNIPAQUE IOHEXOL 300 MG/ML SOLN  COMPARISON:  None.  FINDINGS: The lung bases are clear without focal nodule, mass, or airspace disease. Heart size is normal. No significant pleural or pericardial effusion is present.  Liver and spleen are within normal limits. Stomach, duodenum, and pancreas are unremarkable. The common bile duct and gallbladder are normal. The adrenal glands are normal bilaterally. There is heterogeneous perfusion of the  the  There is heterogeneous profusion of the kidneys bilaterally no discrete abscess is present. The ureters and urinary bladder are within normal limits. Numerous sub cm lymph nodes are present in the retroperitoneum and within the renal hila bilaterally.  The rectosigmoid colon is within normal limits. The remainder of the colon is within normal limits. The appendix is visualized and normal. Small bowel is unremarkable.  The bone windows are within normal limits.  IMPRESSION: 1. Heterogeneous perfusion of both kidneys. In the setting of leukocytosis and abnormal urinalysis this is most consistent with bilateral pyelonephritis. 2. Extensive retroperitoneal and bilateral renal hilar lymph nodes are likely reactive.   Electronically Signed   By: Marin Robertshristopher  Mattern M.D.   On: 06/09/2015 17:37     Medical Consultants:    None.  Anti-Infectives:    Rocephin 06/09/15--->  Subjective:  [name] reports that she had a significant amount of nausea with vomiting yesterday, none today. We'll has some flank pain, mostly on the left. Feels much better than she did yesterday. Still spiking fevers.  Objective:    Filed Vitals:   06/09/15 1944 06/09/15 2008 06/10/15 0449 06/10/15 0545  BP: 113/64 130/73 132/71   Pulse: 98 107 119   Temp:  99 F (37.2 C) 102.1  F (38.9 C) 100.2 F (37.9 C)  TempSrc:  Oral Oral Oral  Resp: Height:   (1.6 m)    Weight:  83.915 kg (185 lb)    SpO2: 99% 100% 99%     Intake/Output Summary (Last 24 hours) at 06/10/15 0748 Last data filed at 06/10/15 0741  Gross per 24 hour  Intake      0 ml  Output   2475 ml  Net  -2475 ml    Exam: Gen:  Nontoxic-appearing Cardiovascular:  Tachycardic, No M/R/G Respiratory:  Lungs CTAB Gastrointestinal:  Abdomen soft, NT/ND, + BS Extremities:  No C/E/C   Data Reviewed:    Labs: Basic Metabolic Panel:  Recent Labs Lab 06/09/15 1457 06/09/15 1504 06/10/15 0410  NA 138 138 138  K 3.0* 3.0* 3.4*  CL 103 103 106  CO2 22  --  22  GLUCOSE 144* 150* 111*  BUN CREATININE 1.37* 1.30* 1.02*  CALCIUM 8.7*  --  8.3*   GFR Estimated Creatinine Clearance: 88 mL/min (by C-G formula based on Cr of 1.02). Liver Function Tests:  Recent Labs Lab 06/09/15 1457  AST 31  ALT 15  ALKPHOS 76  BILITOT 0.8  PROT 8.7*  ALBUMIN 3.1*   CBC:  Recent Labs Lab 06/09/15 1457 06/09/15 1504 06/10/15 0410  WBC 31.1*  --  25.0*  NEUTROABS 25.2*  --   --   HGB 7.3* 9.2* 6.2*  HCT 24.5* 27.0* 21.5*  MCV 65.3*  --  66.8*  PLT 570*  --  477*   Sepsis Labs:  Recent Labs Lab 06/09/15 1457 06/09/15 1505 06/10/15 0410  WBC 31.1*  --  25.0*  LATICACIDVEN  --  1.99  --    Microbiology No results found for this or any previous visit (from the past 240 hour(s)).   Medications:   . sodium chloride   Intravenous Once  . cefTRIAXone (ROCEPHIN)  IV  1 g Intravenous Q24H  . enoxaparin (LOVENOX) injection  40 mg Subcutaneous Q24H  . multivitamin with minerals  1 tablet Oral QAC breakfast   Continuous Infusions: . sodium chloride 100 mL/hr at 06/09/15 2019    Time spent: 25 minutes   LOS: 1 day   Keryl Gholson  [facility] Pager [phone]. If unable to reach me by pager, please call my cell phone at [phone].  *Please refer to  [email or web address], password [email or web address] to get updated schedule on who will round on this patient, as hospitalists switch teams weekly. If 7PM-7AM, please contact night-coverage at [email or web address], password [email or web address] for any overnight needs.  06/10/2015, 7:48 AM

## 2015-06-11 DIAGNOSIS — D509 Iron deficiency anemia, unspecified: Secondary | ICD-10-CM

## 2015-06-11 DIAGNOSIS — E876 Hypokalemia: Secondary | ICD-10-CM

## 2015-06-11 DIAGNOSIS — A419 Sepsis, unspecified organism: Principal | ICD-10-CM

## 2015-06-11 DIAGNOSIS — N12 Tubulo-interstitial nephritis, not specified as acute or chronic: Secondary | ICD-10-CM

## 2015-06-11 LAB — TYPE AND SCREEN
ABO/RH(D): A POS
ANTIBODY SCREEN: NEGATIVE
UNIT DIVISION: 0

## 2015-06-11 LAB — BASIC METABOLIC PANEL
Anion gap: 9 (ref 5–15)
BUN: 7 mg/dL (ref 6–20)
CHLORIDE: 108 mmol/L (ref 101–111)
CO2: 22 mmol/L (ref 22–32)
Calcium: 8.7 mg/dL — ABNORMAL LOW (ref 8.9–10.3)
Creatinine, Ser: 0.91 mg/dL (ref 0.44–1.00)
GFR calc non Af Amer: >60 mL/min (ref >60)
Glucose, Bld: 114 mg/dL — ABNORMAL HIGH (ref 65–99)
POTASSIUM: 3.3 mmol/L — AB (ref 3.5–5.1)
Sodium: 139 mmol/L (ref 135–145)

## 2015-06-11 LAB — URINE CULTURE

## 2015-06-11 LAB — CBC
HCT: 21.5 % — ABNORMAL LOW (ref 36.0–46.0)
HCT: 25.3 % — ABNORMAL LOW (ref 36.0–46.0)
Hemoglobin: 6.2 g/dL — CL (ref 12.0–15.0)
Hemoglobin: 7.7 g/dL — ABNORMAL LOW (ref 12.0–15.0)
MCH: 19.3 pg — ABNORMAL LOW (ref 26.0–34.0)
MCH: 20.4 pg — ABNORMAL LOW (ref 26.0–34.0)
MCHC: 28.8 g/dL — AB (ref 30.0–36.0)
MCHC: 30.4 g/dL (ref 30.0–36.0)
MCV: 66.8 fL — ABNORMAL LOW (ref 78.0–100.0)
MCV: 66.9 fL — ABNORMAL LOW (ref 78.0–100.0)
PLATELETS: 503 10*3/uL — AB (ref 150–400)
Platelets: 477 10*3/uL — ABNORMAL HIGH (ref 150–400)
RBC: 3.22 MIL/uL — ABNORMAL LOW (ref 3.87–5.11)
RBC: 3.78 MIL/uL — ABNORMAL LOW (ref 3.87–5.11)
RDW: 20.4 % — ABNORMAL HIGH (ref 11.5–15.5)
RDW: 20.4 % — AB (ref 11.5–15.5)
WBC: 25 10*3/uL — AB (ref 4.0–10.5)
WBC: 11.4 10*3/uL — ABNORMAL HIGH (ref 4.0–10.5)

## 2015-06-11 MED ORDER — FERROUS SULFATE 325 (65 FE) MG PO TABS: 325.0000 mg | ORAL_TABLET | Freq: Every day | ORAL | Status: DC

## 2015-06-11 MED ORDER — ONDANSETRON HCL 4 MG PO TABS: 4.0000 mg | ORAL_TABLET | Freq: Three times a day (TID) | ORAL | Status: DC | PRN

## 2015-06-11 MED ORDER — SULFAMETHOXAZOLE-TRIMETHOPRIM 800-160 MG PO TABS: 1.0000 | ORAL_TABLET | Freq: Two times a day (BID) | ORAL | Status: DC

## 2015-06-11 MED ORDER — ACETAMINOPHEN 325 MG PO TABS: 650.0000 mg | ORAL_TABLET | Freq: Four times a day (QID) | ORAL | Status: DC | PRN

## 2015-06-11 NOTE — Discharge Instructions (Signed)
Pyelonephritis, Adult °Pyelonephritis is a kidney infection. A kidney infection can happen quickly, or it can last for a long time. °HOME CARE  °· Take your medicine (antibiotics) as told. Finish it even if you start to feel better. °· Keep all doctor visits as told. °· Drink enough fluids to keep your pee (urine) clear or pale yellow. °· Only take medicine as told by your doctor. °GET HELP RIGHT AWAY IF:  °· You have a fever or lasting symptoms for more than 2-3 days. °· You have a fever and your symptoms suddenly get worse. °· You cannot take your medicine or drink fluids as told. °· You have chills and shaking. °· You feel very weak or pass out (faint). °· You do not feel better after 2 days. °MAKE SURE YOU: °· Understand these instructions. °· Will watch your condition. °· Will get help right away if you are not doing well or get worse. °Document Released: 12/31/2004 Document Revised: 05/24/2012 Document Reviewed: 05/13/2011 °ExitCare® Patient Information ©2015 [employer]. This information is not intended to replace advice given to you by your health care provider. Make sure you discuss any questions you have with your health care provider. ° °

## 2015-06-11 NOTE — Discharge Summary (Signed)
Physician Discharge Summary  Nicole Patel ZOX:[id number] DOB: 05/17/1991 DOA: 06/09/2015  PCP: No primary care provider on file. - Information provided to the pt about [facility]   Admit date: 06/09/2015 Discharge date: 06/11/2015  Recommendations for Outpatient Follow-up:  1. Continue Bactrim for 10 days on discharge for treatment of pyelonephritis. 2. Your white blood cell count is markedly better this morning, 11.4. 3. Your hemoglobin is better this morning, 7.7. Continue iron supplementation on discharge.  Discharge Diagnoses:  Principal Problem:   Sepsis Active Problems:   Pyelonephritis   Sinus tachycardia   Microcytic anemia   Hypokalemia   Discharge Condition: stable   Diet recommendation: as tolerated   History of present illness:  24 y.o. female with no significant PMH who was admitted 06/09/15 with fever and tachycardia. CT of the abdomen done on admission showed bilateral pyelonephritis. She was placed on empiric Rocephin.   Hospital Course:   Assessment/Plan:   Principal Problem:  Sepsis secondary to Pyelonephritis - Continue bactrim on discharge as prescribed - Blood and urine cultures showed no growth to date - WBC slightly improved.  Active Problems:  Hypokalemia - Supplemented.   Sinus tachycardia - Secondary to sepsis and anemia.    Microcytic anemia / iron deficiency anemia - Likely secondary to menstrual losses  - 1 unit of PRBCs given on admission. - Continue ferrous sulfate supplementation on discharge    DVT Prophylaxis - Lovenox subQ while pt in hospital    Medical Consultants:    None.  Anti-Infectives:    Rocephin 06/09/15---> 06/11/2015    Signed:  Manson Passey, MD  [facility] 06/11/2015, 10:27 AM  Pager #: [phone]  Time spent in minutes: more than 30 minutes   Discharge Exam: Filed Vitals:   06/11/15 1012  BP: 154/74  Pulse: 93  Temp: 98.6 F (37 C)  Resp: 18   Filed Vitals:   06/10/15 2058  06/11/15 0210 06/11/15 0615 06/11/15 1012  BP: 147/68 126/73 132/74 154/74  Pulse: 100 108 102 93  Temp: 101.5 F (38.6 C) 98.7 F (37.1 C) 98.6 F (37 C) 98.6 F (37 C)  TempSrc: Oral Oral Oral Oral  Resp: Height:      Weight:      SpO2: 98% 100% 100% 100%    General: Pt is alert, follows commands appropriately, not in acute distress Cardiovascular: Regular rate and rhythm, S1/S2 +, no murmurs Respiratory: Clear to auscultation bilaterally, no wheezing, no crackles, no rhonchi Abdominal: Soft, non tender, non distended, bowel sounds +, no guarding Extremities: no edema, no cyanosis, pulses palpable bilaterally DP and PT Neuro: Grossly nonfocal  Discharge Instructions  Discharge Instructions    Call MD for:  difficulty breathing, headache or visual disturbances    Complete by:  As directed      Call MD for:  persistant nausea and vomiting    Complete by:  As directed      Call MD for:  severe uncontrolled pain    Complete by:  As directed      Diet - low sodium heart healthy    Complete by:  As directed      Discharge instructions    Complete by:  As directed   1. Continue Bactrim for 10 days on discharge for treatment of pyelonephritis. 2. Your white blood cell count is markedly better this morning, 11.4. 3. Your hemoglobin is better this morning, 7.7. Continue iron supplementation on discharge.     Increase activity  slowly    Complete by:  As directed             Medication List    TAKE these medications        acetaminophen 325 MG tablet  Commonly known as:  TYLENOL  Take 2 tablets (650 mg total) by mouth every 6 (six) hours as needed for moderate pain.     ferrous sulfate 325 (65 FE) MG tablet  Take 1 tablet (325 mg total) by mouth daily with breakfast.     FUSION PLUS Caps  Take 1 capsule by mouth daily before breakfast.     ondansetron 4 MG tablet  Commonly known as:  ZOFRAN  Take 1 tablet (4 mg total) by mouth every 8 (eight) hours as  needed for nausea or vomiting.     sulfamethoxazole-trimethoprim 800-160 MG per tablet  Commonly known as:  BACTRIM DS,SEPTRA DS  Take 1 tablet by mouth 2 (two) times daily.           Follow-up Information    Follow up with [facility]    . Schedule an appointment as soon as possible for a visit in 1 week.   Why:  Follow up appt after recent hospitalization   Contact information:   [address] [id number] [phone]       The results of significant diagnostics from this hospitalization (including imaging, microbiology, ancillary and laboratory) are listed below for reference.    Significant Diagnostic Studies: Dg Chest 2 View 06/09/2015   No active cardiopulmonary disease.   Electronically Signed   By: Sherian ReinWei-Chen  Lin M.D.   On: 06/09/2015 15:08   Ct Abdomen Pelvis W Contrast 06/09/2015  1. Heterogeneous perfusion of both kidneys. In the setting of leukocytosis and abnormal urinalysis this is most consistent with bilateral pyelonephritis. 2. Extensive retroperitoneal and bilateral renal hilar lymph nodes are likely reactive.   Electronically Signed   By: Marin Robertshristopher  Mattern M.D.   On: 06/09/2015 17:37    Microbiology: Recent Results (from the past 240 hour(s))  Culture, blood (routine x 2)     Status: None (Preliminary result)   Collection Time: 06/09/15  3:20 PM  Result Value Ref Range Status   Specimen Description RIGHT ANTECUBITAL  Final   Special Requests BOTTLES DRAWN AEROBIC AND ANAEROBIC 5CC  Final   Culture   Final    NO GROWTH < 24 HOURS Performed at [facility]    Report Status PENDING  Incomplete  Culture, blood (routine x 2)     Status: None (Preliminary result)   Collection Time: 06/09/15  3:29 PM  Result Value Ref Range Status   Specimen Description LEFT ANTECUBITAL  Final   Special Requests BOTTLES DRAWN AEROBIC AND ANAEROBIC 5CC  Final   Culture   Final    NO GROWTH < 24 HOURS Performed  at [facility]    Report Status PENDING  Incomplete  Urine culture     Status: None (Preliminary result)   Collection Time: 06/09/15  4:30 PM  Result Value Ref Range Status   Specimen Description URINE, CLEAN CATCH  Final   Special Requests NONE  Final   Culture   Final    NO GROWTH < 24 HOURS [facility]    Report Status PENDING  Incomplete     Labs: Basic Metabolic Panel:  Recent Labs Lab 06/09/15 1457 06/09/15 1504 06/10/15 0410  NA 138 138 138  K 3.0* 3.0* 3.4*  CL 103 103 106  CO2 22  --  22  GLUCOSE 144* 150* 111*  BUN 9 7 7   CREATININE 1.37* 1.30* 1.02*  CALCIUM 8.7*  --  8.3*   Liver Function Tests:  Recent Labs Lab 06/09/15 1457  AST 31  ALT 15  ALKPHOS 76  BILITOT 0.8  PROT 8.7*  ALBUMIN 3.1*   No results for input(s): LIPASE, AMYLASE in the last 168 hours. No results for input(s): AMMONIA in the last 168 hours. CBC:  Recent Labs Lab 06/09/15 1457 06/09/15 1504 06/10/15 0410 06/11/15 0952  WBC 31.1*  --  25.0* 11.4*  NEUTROABS 25.2*  --   --   --   HGB 7.3* 9.2* 6.2* 7.7*  HCT 24.5* 27.0* 21.5* 25.3*  MCV 65.3*  --  66.8* 66.9*  PLT 570*  --  477* 503*   Cardiac Enzymes: No results for input(s): CKTOTAL, CKMB, CKMBINDEX, TROPONINI in the last 168 hours. BNP: BNP (last 3 results) No results for input(s): BNP in the last 8760 hours.  ProBNP (last 3 results) No results for input(s): PROBNP in the last 8760 hours.  CBG: No results for input(s): GLUCAP in the last 168 hours.

## 2015-06-11 NOTE — Progress Notes (Signed)
Patient has no PCP, case manager notified to give patient list for PCP.

## 2015-06-11 NOTE — Care Management Note (Signed)
Case Management Note  Patient Details  Name: KAULA KLENKE MRN: [id number] Date of Birth: 1991/03/06  Subjective/Objective:               24 yo admitted with Sepsis     Action/Plan: From home with mother  Expected Discharge Date:                  Expected Discharge Plan:  Home/Self Care  In-House Referral:     Discharge planning Services  CM Consult, [facility]  Post Acute Care Choice:    Choice offered to:     DME Arranged:    DME Agency:     HH Arranged:    [place] Agency:     Status of Service:  Completed, signed off  Medicare Important Message Given:    Date Medicare IM Given:    Medicare IM give by:    Date Additional Medicare IM Given:    Additional Medicare Important Message give by:     If discussed at [place] of Stay Meetings, dates discussed:    Additional Comments: This CM was informed by staff RN that pt did not have PCP. This CM met with pt and mother at bedside.  Because pt has Medicaid insurance pt was instructed to look on the back of her Medicaid card to see who is listed as her assigned PCP. Pt also given [place] packet to use as a backup for a PCP. No other DC needs noted.  Lynnell Catalan, RN 06/11/2015, 2:52 PM

## 2015-06-14 LAB — CULTURE, BLOOD (ROUTINE X 2)
CULTURE: NO GROWTH
Culture: NO GROWTH

## 2017-02-18 IMAGING — CT CT ABD-PELV W/ CM
2 of 4 series · 16 of 46 positions shown, 18 images · IV contrast (OMNIPAQUE 300)
Comparison: None.

CLINICAL DATA: Bilateral flank pain, emesis, and fever for over 1
month. [DATE] pain in the low back predominantly extending to the
left flank area. Leukocytosis. Abnormal urinalysis.

EXAM:
CT ABDOMEN AND PELVIS WITH CONTRAST
TECHNIQUE: Multidetector CT imaging of the abdomen and pelvis was performed
using the standard protocol following bolus administration of
intravenous contrast.
CONTRAST:  50mL OMNIPAQUE IOHEXOL 300 MG/ML SOLN, 100mL OMNIPAQUE
IOHEXOL 300 MG/ML SOLN

[Series 2: abd/pel with · axial · 0.70mm/px · z∈[-451,-16]mm · 13 of 95 slices shown, 15 images]
[im 4/95  soft-tissue]
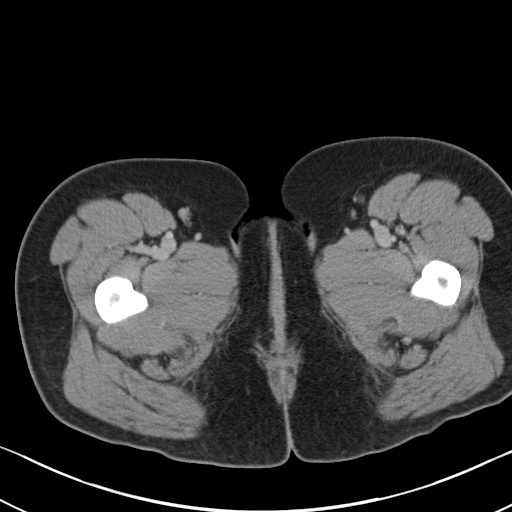
[im 4/95  bone]
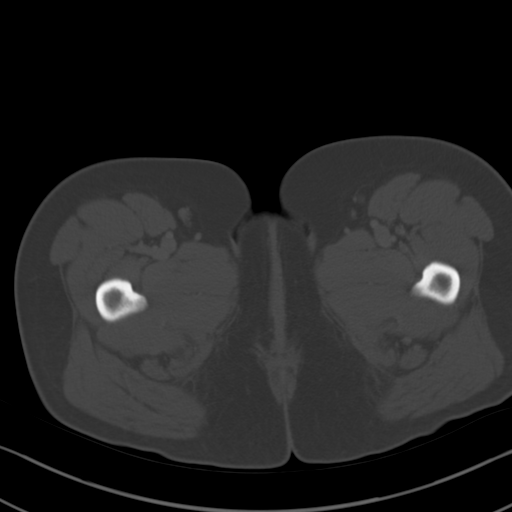
[im 12/95  soft-tissue]
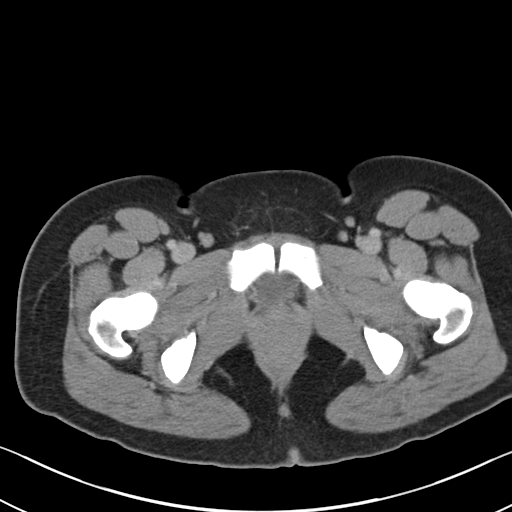
[im 19/95  soft-tissue]
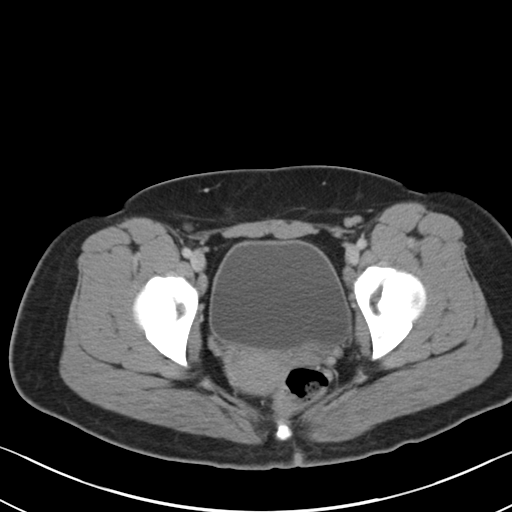
[im 27/95  soft-tissue]
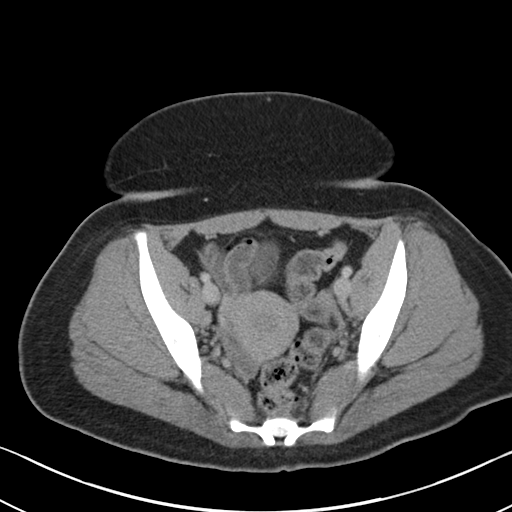
[im 34/95  soft-tissue]
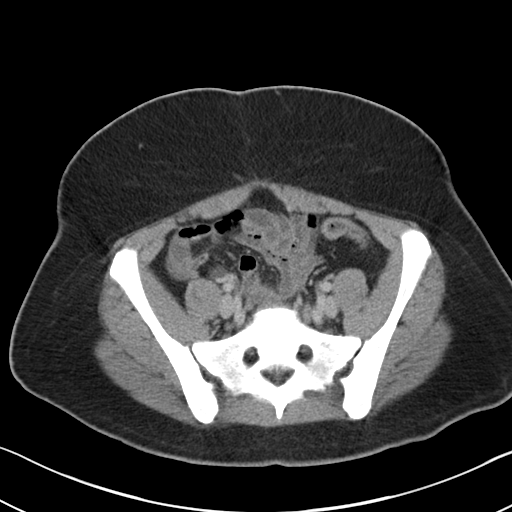
[im 42/95  soft-tissue]
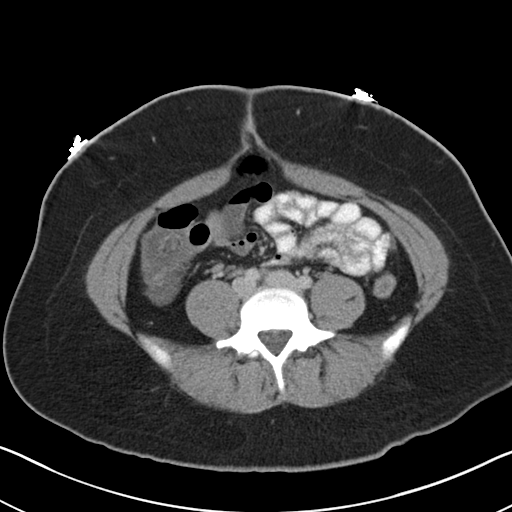
[im 49/95  soft-tissue]
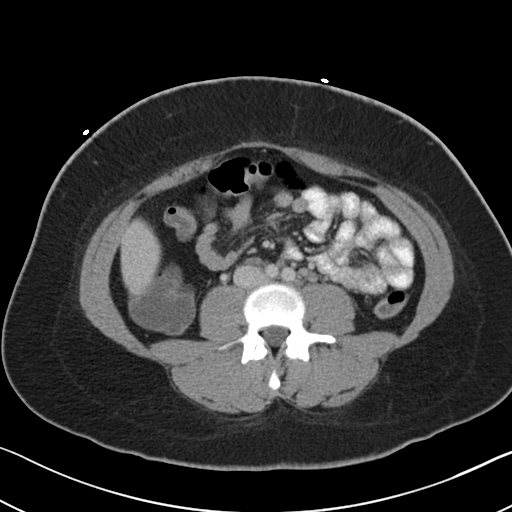
[im 53/95  soft-tissue]
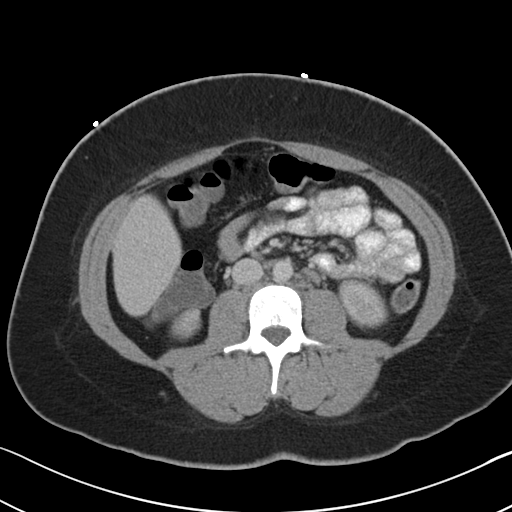
[im 61/95  soft-tissue]
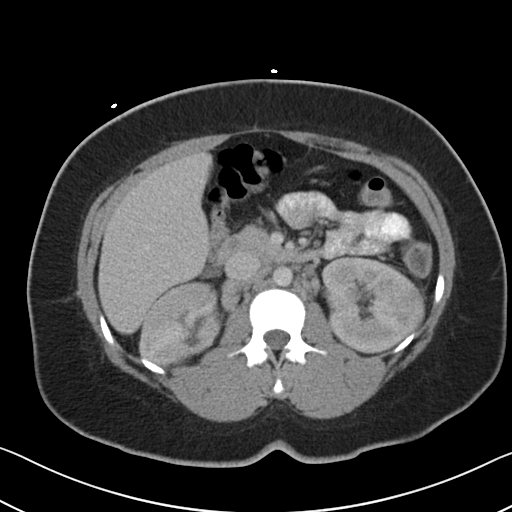
[im 61/95  bone]
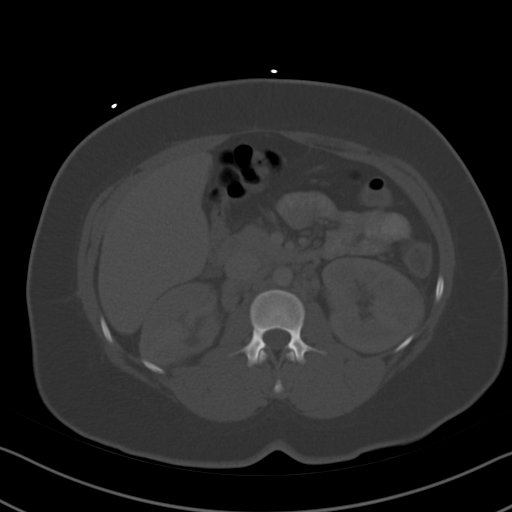
[im 68/95  soft-tissue]
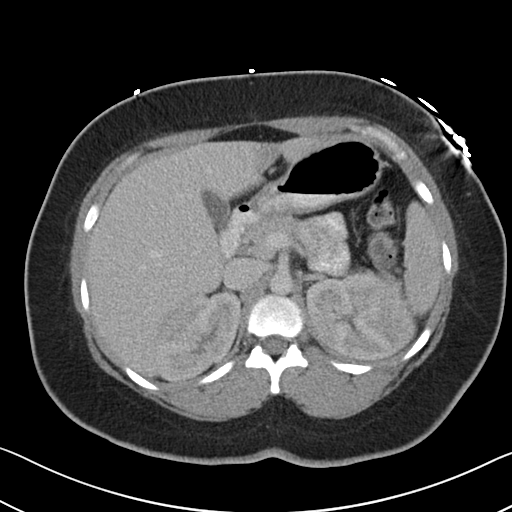
[im 76/95  soft-tissue]
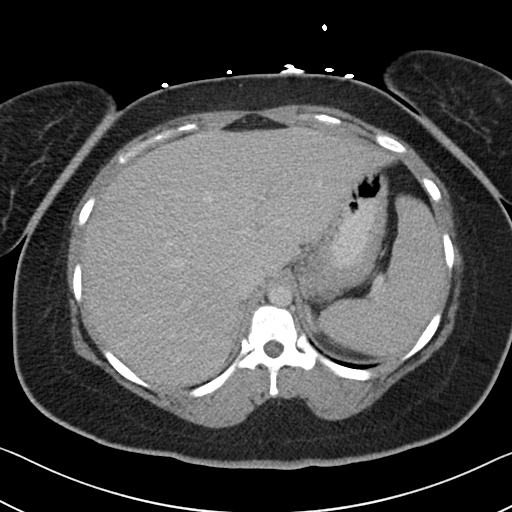
[im 83/95  soft-tissue]
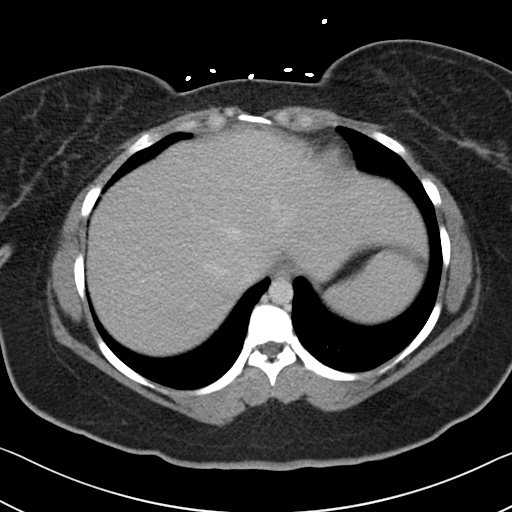
[im 91/95  soft-tissue]
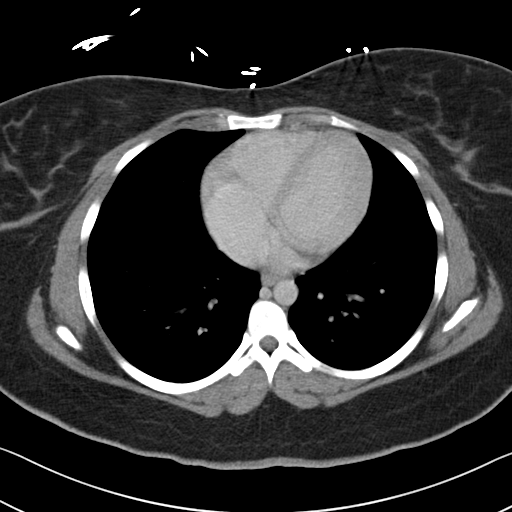

[Series 5: coronal a/|p · coronal · 0.74mm/px · 3 of 126 slices shown]
[im 42/126  soft-tissue]
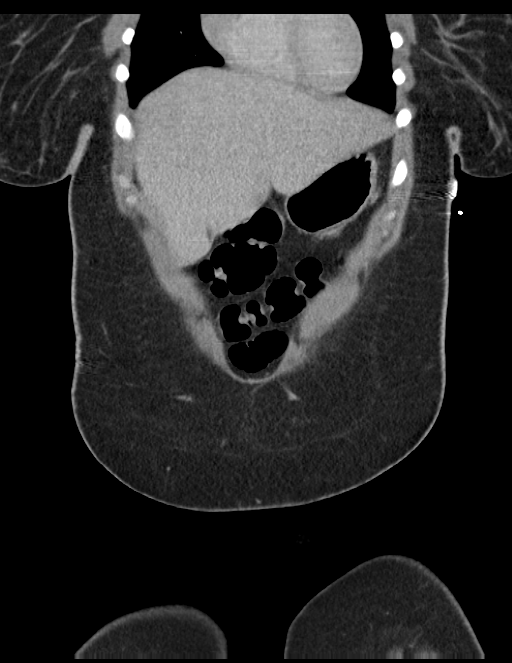
[im 56/126  soft-tissue]
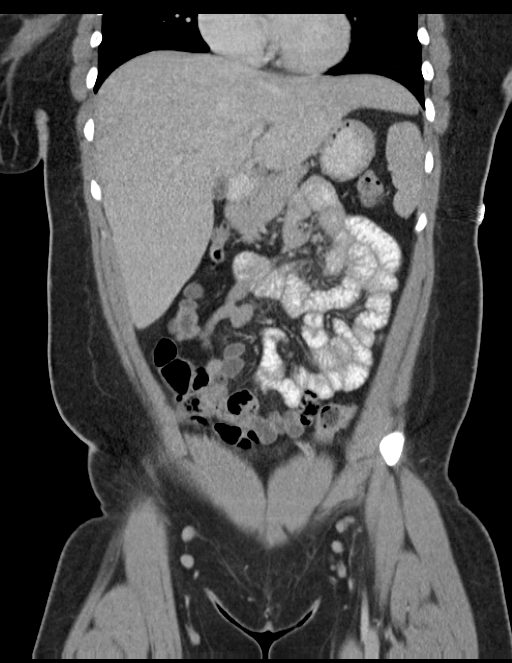
[im 70/126  soft-tissue]
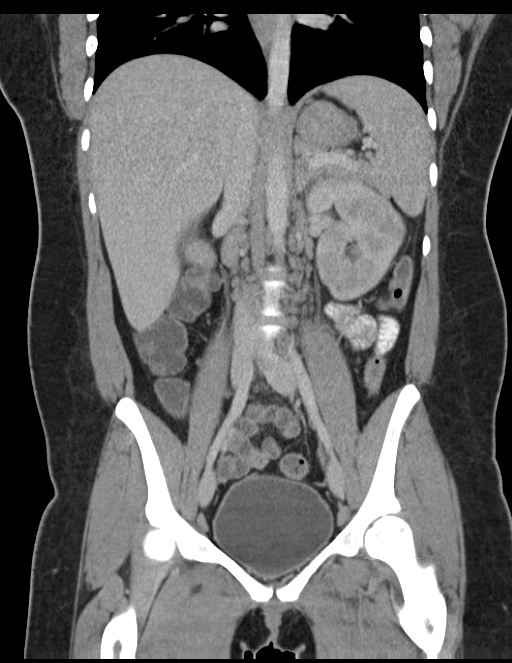

[16 of 46 positions shown; findings below may reference images not displayed]

FINDINGS: The lung bases are clear without focal nodule, mass, or airspace
disease. Heart size is normal. No significant pleural or pericardial
effusion is present.

Liver and spleen are within normal limits. Stomach, duodenum, and
pancreas are unremarkable. The common bile duct and gallbladder are
normal. The adrenal glands are normal bilaterally. There is
heterogeneous perfusion of the ***---*** the

There is heterogeneous profusion of the kidneys bilaterally no
discrete abscess is present. The ureters and urinary bladder are
within normal limits. Numerous sub cm lymph nodes are present in the
retroperitoneum and within the renal hila bilaterally.

The rectosigmoid colon is within normal limits. The remainder of the
colon is within normal limits. The appendix is visualized and
normal. Small bowel is unremarkable.

The bone windows are within normal limits.
IMPRESSION: 1. Heterogeneous perfusion of both kidneys. In the setting of
leukocytosis and abnormal urinalysis this is most consistent with
bilateral pyelonephritis.
2. Extensive retroperitoneal and bilateral renal hilar lymph nodes
are likely reactive.

## 2017-05-26 ENCOUNTER — Ambulatory Visit (HOSPITAL_COMMUNITY)
Admission: EM | Admit: 2017-05-26 | Discharge: 2017-05-26 | Disposition: A | Discharge status: Home / Self Care | Payer: Medicaid Other | Attending: Family Medicine | Admitting: Family Medicine

## 2017-05-26 ENCOUNTER — Encounter (HOSPITAL_COMMUNITY): Payer: Self-pay | Admitting: Emergency Medicine

## 2017-05-26 DIAGNOSIS — R011 Cardiac murmur, unspecified: Secondary | ICD-10-CM

## 2017-05-26 DIAGNOSIS — J40 Bronchitis, not specified as acute or chronic: Secondary | ICD-10-CM

## 2017-05-26 MED ORDER — BENZONATATE 100 MG PO CAPS: 100.0000 mg | ORAL_CAPSULE | Freq: Three times a day (TID) | ORAL | 0 refills | Status: AC | PRN

## 2017-05-26 MED ORDER — HYDROCODONE-HOMATROPINE 5-1.5 MG/5ML PO SYRP: 5.0000 mL | ORAL_SOLUTION | Freq: Every evening | ORAL | 0 refills | Status: AC | PRN

## 2017-05-26 NOTE — Discharge Instructions (Signed)
Make an appointment with the cardiologist noted below to have the heart murmur better evaluated

## 2017-05-26 NOTE — ED Provider Notes (Signed)
[facility]    CSN: [id number] Arrival date & time: 05/26/17  1823     History   Chief Complaint Chief Complaint  Patient presents with  . Cough    HPI Nicole Patel is a 26 y.o. female.   [facility] with a complaint of a cough and chest and back soreness x 7 days. She has had no fever, no phlegm production, and no history of asthma. She's not short of breath but does have some substernal aching that radiates to her back.      Past Medical History:  Diagnosis Date  . Anemia     Patient Active Problem List   Diagnosis Date Noted  . Hypokalemia 06/10/2015  . Pyelonephritis 06/09/2015  . Sepsis (HCC) 06/09/2015  . Sinus tachycardia 06/09/2015  . Microcytic anemia 06/09/2015    History reviewed. No pertinent surgical history.  OB History    Gravida Para Term Preterm AB Living   1 1 1  0 0 1   SAB TAB Ectopic Multiple Live Births   0 0 0 0 1       Home Medications    Prior to Admission medications   Medication Sig Start Date End Date Taking? Authorizing Provider  benzonatate (TESSALON) 100 MG capsule Take 1-2 capsules (100-200 mg total) by mouth 3 (three) times daily as needed for cough. 05/26/17   Elvina SidleLauenstein, Trew Sunde, MD  HYDROcodone-homatropine (HYDROMET) 5-1.5 MG/5ML syrup Take 5 mLs by mouth at bedtime as needed and may repeat dose one time if needed for cough. 05/26/17   Elvina SidleLauenstein, Tashira Torre, MD    Family History Family History  Problem Relation Age of Onset  . Diabetes Mother   . Hyperlipidemia Mother   . Hypertension Mother   . Hypertension Father   . Hyperlipidemia Father   . Diabetes Father     Social History Social History  Substance Use Topics  . Smoking status: Never Smoker  . Smokeless tobacco: Never Used  . Alcohol use No     Allergies   Amoxicillin   Review of Systems Review of Systems  Constitutional: Negative.   Respiratory: Positive for cough.   Cardiovascular: Positive for chest pain.      Physical Exam Triage Vital Signs ED Triage Vitals  Enc Vitals Group     BP 05/26/17 1852 (!) 144/77     Pulse Rate 05/26/17 1852 (!) 112     Resp 05/26/17 1852 18     Temp 05/26/17 1852 99.6 F (37.6 C)     Temp Source 05/26/17 1852 Oral     SpO2 05/26/17 1852 100 %     Weight --      Height --      Head Circumference --      Peak Flow --      Pain Score 05/26/17 1851 7     Pain Loc --      Pain Edu? --      Excl. in GC? --    No data found.   Updated Vital Signs BP (!) 144/77 (BP Location: Right Arm)   Pulse (!) 112   Temp 99.6 F (37.6 C) (Oral)   Resp 18   SpO2 100%    Physical Exam  Constitutional: She is oriented to person, place, and time. She appears well-developed and well-nourished.  HENT:  Right Ear: External ear normal.  Left Ear: External ear normal.  Mouth/Throat: Oropharynx is clear and moist.  Eyes: Conjunctivae are normal. Pupils  are equal, round, and reactive to light.  Neck: Normal range of motion. Neck supple.  Cardiovascular: Normal rate and regular rhythm.   Murmur heard. Pulmonary/Chest: Effort normal and breath sounds normal.  Musculoskeletal: Normal range of motion.  Neurological: She is alert and oriented to person, place, and time.  Skin: Skin is warm and dry.  Nursing note and vitals reviewed.    UC Treatments / Results  Labs (all labs ordered are listed, but only abnormal results are displayed) Labs Reviewed - No data to display  EKG  EKG Interpretation None       Radiology No results found.  Procedures Procedures (including critical care time)  Medications Ordered in UC Medications - No data to display   Initial Impression / Assessment and Plan / UC Course  I have reviewed the triage vital signs and the nursing notes.  Pertinent labs & imaging results that were available during my care of the patient were reviewed by me and considered in my medical decision making (see chart for details).     Final  Clinical Impressions(s) / UC Diagnoses   Final diagnoses:  Bronchitis  Heart murmur, systolic    New Prescriptions New Prescriptions   BENZONATATE (TESSALON) 100 MG CAPSULE    Take 1-2 capsules (100-200 mg total) by mouth 3 (three) times daily as needed for cough.   HYDROCODONE-HOMATROPINE (HYDROMET) 5-1.5 MG/5ML SYRUP    Take 5 mLs by mouth at bedtime as needed and may repeat dose one time if needed for cough.     Elvina Sidle, MD 05/26/17 [phone]

## 2017-05-26 NOTE — ED Triage Notes (Signed)
[facility] a [facility] of [facility] cough and chest and back soreness x 2 days.
